# Patient Record
Sex: Female | Born: 1945 | Race: White | Hispanic: No | Marital: Single | Smoking: Never smoker
Health system: Southern US, Community
[De-identification: ages and names within clinical notes are randomized; demographics above are authoritative.]

## PROBLEM LIST (undated history)

## (undated) DIAGNOSIS — R112 Nausea with vomiting, unspecified: Secondary | ICD-10-CM

## (undated) DIAGNOSIS — T4145XA Adverse effect of unspecified anesthetic, initial encounter: Secondary | ICD-10-CM

## (undated) DIAGNOSIS — M1990 Unspecified osteoarthritis, unspecified site: Secondary | ICD-10-CM

## (undated) DIAGNOSIS — T8859XA Other complications of anesthesia, initial encounter: Secondary | ICD-10-CM

## (undated) DIAGNOSIS — R519 Headache, unspecified: Secondary | ICD-10-CM

## (undated) DIAGNOSIS — R011 Cardiac murmur, unspecified: Secondary | ICD-10-CM

## (undated) DIAGNOSIS — R51 Headache: Secondary | ICD-10-CM

## (undated) DIAGNOSIS — Z9889 Other specified postprocedural states: Secondary | ICD-10-CM

## (undated) DIAGNOSIS — J189 Pneumonia, unspecified organism: Secondary | ICD-10-CM

## (undated) DIAGNOSIS — D649 Anemia, unspecified: Secondary | ICD-10-CM

## (undated) HISTORY — PX: OTHER SURGICAL HISTORY: SHX169

## (undated) HISTORY — PX: JOINT REPLACEMENT: SHX530

## (undated) HISTORY — PX: BILATERAL TEMPOROMANDIBULAR JOINT ARTHROPLASTY: SUR77

## (undated) HISTORY — PX: APPENDECTOMY: SHX54

## (undated) HISTORY — PX: HAMMER TOE SURGERY: SHX385

## (undated) HISTORY — PX: ABDOMINAL HYSTERECTOMY: SHX81

---

## 1998-10-15 ENCOUNTER — Ambulatory Visit (HOSPITAL_COMMUNITY): Admission: RE | Admit: 1998-10-15 | Discharge: 1998-10-15 | Payer: Self-pay | Admitting: Obstetrics and Gynecology

## 1998-10-29 ENCOUNTER — Ambulatory Visit (HOSPITAL_COMMUNITY): Admission: RE | Admit: 1998-10-29 | Discharge: 1998-10-29 | Payer: Self-pay | Admitting: Obstetrics and Gynecology

## 1998-10-29 ENCOUNTER — Encounter: Payer: Self-pay | Admitting: Obstetrics and Gynecology

## 1998-11-12 ENCOUNTER — Other Ambulatory Visit: Admission: RE | Admit: 1998-11-12 | Discharge: 1998-11-12 | Payer: Self-pay | Admitting: Obstetrics and Gynecology

## 1999-03-11 ENCOUNTER — Other Ambulatory Visit: Admission: RE | Admit: 1999-03-11 | Discharge: 1999-03-11 | Payer: Self-pay | Admitting: Obstetrics and Gynecology

## 1999-12-29 ENCOUNTER — Other Ambulatory Visit: Admission: RE | Admit: 1999-12-29 | Discharge: 1999-12-29 | Payer: Self-pay | Admitting: Obstetrics and Gynecology

## 2001-03-21 ENCOUNTER — Other Ambulatory Visit: Admission: RE | Admit: 2001-03-21 | Discharge: 2001-03-21 | Payer: Self-pay | Admitting: Obstetrics and Gynecology

## 2002-05-16 ENCOUNTER — Other Ambulatory Visit: Admission: RE | Admit: 2002-05-16 | Discharge: 2002-05-16 | Payer: Self-pay | Admitting: Obstetrics and Gynecology

## 2003-07-27 ENCOUNTER — Other Ambulatory Visit: Admission: RE | Admit: 2003-07-27 | Discharge: 2003-07-27 | Payer: Self-pay | Admitting: Obstetrics and Gynecology

## 2003-11-08 ENCOUNTER — Ambulatory Visit (HOSPITAL_COMMUNITY): Admission: RE | Admit: 2003-11-08 | Discharge: 2003-11-08 | Payer: Self-pay | Admitting: Obstetrics and Gynecology

## 2004-06-13 ENCOUNTER — Ambulatory Visit: Payer: Self-pay | Admitting: Internal Medicine

## 2004-06-26 ENCOUNTER — Ambulatory Visit: Payer: Self-pay | Admitting: Internal Medicine

## 2005-03-20 ENCOUNTER — Other Ambulatory Visit: Admission: RE | Admit: 2005-03-20 | Discharge: 2005-03-20 | Payer: Self-pay | Admitting: Obstetrics and Gynecology

## 2005-12-21 ENCOUNTER — Emergency Department (HOSPITAL_COMMUNITY): Admission: EM | Admit: 2005-12-21 | Discharge: 2005-12-21 | Payer: Self-pay | Admitting: Emergency Medicine

## 2006-07-08 ENCOUNTER — Other Ambulatory Visit: Admission: RE | Admit: 2006-07-08 | Discharge: 2006-07-08 | Payer: Self-pay | Admitting: Obstetrics & Gynecology

## 2008-01-25 ENCOUNTER — Ambulatory Visit: Payer: Self-pay | Admitting: Family Medicine

## 2008-01-25 ENCOUNTER — Encounter: Admission: RE | Admit: 2008-01-25 | Discharge: 2008-01-25 | Payer: Self-pay | Admitting: Family Medicine

## 2009-05-06 ENCOUNTER — Encounter: Admission: RE | Admit: 2009-05-06 | Discharge: 2009-05-06 | Payer: Self-pay | Admitting: Obstetrics and Gynecology

## 2009-05-15 ENCOUNTER — Encounter: Admission: RE | Admit: 2009-05-15 | Discharge: 2009-05-15 | Payer: Self-pay | Admitting: Obstetrics and Gynecology

## 2009-06-04 ENCOUNTER — Encounter (INDEPENDENT_AMBULATORY_CARE_PROVIDER_SITE_OTHER): Payer: Self-pay | Admitting: *Deleted

## 2009-06-17 ENCOUNTER — Ambulatory Visit: Payer: Self-pay | Admitting: Family Medicine

## 2009-11-12 ENCOUNTER — Encounter: Admission: RE | Admit: 2009-11-12 | Discharge: 2009-11-12 | Payer: Self-pay | Admitting: Obstetrics and Gynecology

## 2010-04-21 ENCOUNTER — Encounter (INDEPENDENT_AMBULATORY_CARE_PROVIDER_SITE_OTHER): Payer: Self-pay | Admitting: *Deleted

## 2010-05-12 ENCOUNTER — Encounter
Admission: RE | Admit: 2010-05-12 | Discharge: 2010-05-12 | Payer: Self-pay | Source: Home / Self Care | Attending: Obstetrics and Gynecology | Admitting: Obstetrics and Gynecology

## 2010-07-03 NOTE — Letter (Signed)
Summary: Pre Visit Letter Revised  [facility]  [address] [place], [place] [address]   Phone: [phone]  Fax: [phone]        04/21/2010 MRN: [id number]    Catherine Rowe [address] [place], [place]  [address]   Procedure Date:  June 20, 2010   Welcome to the Gastroenterology Division at [facility].    You are scheduled to see a nurse for your pre-procedure visit on 06/06/10 at 8:30 A.M.  on the 3rd floor at [facility], [address]  We ask that you try to arrive at our office 15 minutes prior to your appointment time to allow for check-in.  Please take a minute to review the attached form.  If you answer "Yes" to one or more of the questions on the first page, we ask that you call the person listed at your earliest opportunity.  If you answer "No" to all of the questions, please complete the rest of the form and bring it to your appointment.    Your nurse visit will consist of discussing your medical and surgical history, your immediate family medical history, and your medications.   If you are unable to list all of your medications on the form, please bring the medication bottles to your appointment and we will list them.  We will need to be aware of both prescribed and over the counter drugs.  We will need to know exact dosage information as well.    Please be prepared to read and sign documents such as consent forms, a financial agreement, and acknowledgement forms.  If necessary, and with your consent, a friend or relative is welcome to sit-in on the nurse visit with you.  Please bring your insurance card so that we may make a copy of it.  If your insurance requires a referral to see a specialist, please bring your referral form from your primary care physician.  No co-pay is required for this nurse visit.     If you cannot keep your appointment, please call [phone] to cancel or reschedule prior to your appointment date.  This  allows [place] the opportunity to schedule an appointment for another patient in need of care.    Thank you for choosing [facility] for your medical needs.  We appreciate the opportunity to care for you.  Please visit [place] at our website  to learn more about our practice.  Sincerely, The Gastroenterology Division

## 2010-07-03 NOTE — Letter (Signed)
Summary: Colonoscopy Date Change Letter  [facility]  [address] [place], [place] [address]   Phone: [phone]  Fax: [phone]      June 04, 2009 MRN: [id number]   Catherine Rowe [address] [place], [place]  [address]   Dear [name],   Previously you were recommended to have a repeat colonoscopy around this time. Your chart was recently reviewed by Dr. Lina Sar of [facility]. Follow up colonoscopy is now recommended in January 2013. This revised recommendation is based on current, nationally recognized guidelines for colorectal cancer screening and polyp surveillance. These guidelines are endorsed by the American Cancer Society, [employer] on Colorectal Cancer as well as numerous other major medical organizations.  Please understand that our recommendation assumes that you do not have any new symptoms such as bleeding, a change in bowel habits, anemia, or significant abdominal discomfort. If you do have any concerning GI symptoms or want to discuss the guideline recommendations, please call to arrange an office visit at your earliest convenience. Otherwise we will keep you in our reminder system and contact you 1-2 months prior to the date listed above to schedule your next colonoscopy.  Thank you,  Hedwig Morton. Juanda Chance, M.D.  [facility] Gastroenterology Division [phone]

## 2011-07-15 ENCOUNTER — Encounter: Payer: Self-pay | Admitting: Internal Medicine

## 2012-02-04 ENCOUNTER — Other Ambulatory Visit: Payer: Self-pay | Admitting: Family Medicine

## 2012-02-04 DIAGNOSIS — R413 Other amnesia: Secondary | ICD-10-CM

## 2012-02-08 ENCOUNTER — Ambulatory Visit
Admission: RE | Admit: 2012-02-08 | Discharge: 2012-02-08 | Disposition: A | Discharge status: Home / Self Care | Payer: Medicare Other | Source: Ambulatory Visit | Attending: Family Medicine | Admitting: Family Medicine

## 2012-02-08 DIAGNOSIS — R413 Other amnesia: Secondary | ICD-10-CM

## 2012-03-21 ENCOUNTER — Other Ambulatory Visit: Payer: Self-pay

## 2012-03-21 DIAGNOSIS — G454 Transient global amnesia: Secondary | ICD-10-CM

## 2012-03-21 DIAGNOSIS — S0990XA Unspecified injury of head, initial encounter: Secondary | ICD-10-CM

## 2012-03-25 ENCOUNTER — Ambulatory Visit (HOSPITAL_COMMUNITY): Payer: Medicare Other | Attending: Internal Medicine | Admitting: Radiology

## 2012-03-25 DIAGNOSIS — I359 Nonrheumatic aortic valve disorder, unspecified: Secondary | ICD-10-CM

## 2012-03-25 DIAGNOSIS — S0990XA Unspecified injury of head, initial encounter: Secondary | ICD-10-CM

## 2012-03-25 DIAGNOSIS — I369 Nonrheumatic tricuspid valve disorder, unspecified: Secondary | ICD-10-CM | POA: Insufficient documentation

## 2012-03-25 DIAGNOSIS — G454 Transient global amnesia: Secondary | ICD-10-CM | POA: Insufficient documentation

## 2012-03-25 DIAGNOSIS — I080 Rheumatic disorders of both mitral and aortic valves: Secondary | ICD-10-CM | POA: Insufficient documentation

## 2012-03-25 NOTE — Progress Notes (Signed)
Echocardiogram performed.  

## 2012-03-29 ENCOUNTER — Encounter (HOSPITAL_COMMUNITY): Payer: Self-pay | Admitting: Neurology

## 2014-06-16 ENCOUNTER — Encounter: Payer: Self-pay | Admitting: Internal Medicine

## 2015-02-26 ENCOUNTER — Encounter: Payer: Self-pay | Admitting: Internal Medicine

## 2015-03-07 DIAGNOSIS — Z780 Asymptomatic menopausal state: Secondary | ICD-10-CM | POA: Diagnosis not present

## 2015-03-07 DIAGNOSIS — M8589 Other specified disorders of bone density and structure, multiple sites: Secondary | ICD-10-CM | POA: Diagnosis not present

## 2015-03-15 DIAGNOSIS — Z1211 Encounter for screening for malignant neoplasm of colon: Secondary | ICD-10-CM | POA: Diagnosis not present

## 2015-09-10 DIAGNOSIS — R69 Illness, unspecified: Secondary | ICD-10-CM | POA: Diagnosis not present

## 2015-11-20 DIAGNOSIS — M1711 Unilateral primary osteoarthritis, right knee: Secondary | ICD-10-CM | POA: Diagnosis not present

## 2015-11-20 DIAGNOSIS — M1712 Unilateral primary osteoarthritis, left knee: Secondary | ICD-10-CM | POA: Diagnosis not present

## 2015-11-20 DIAGNOSIS — M170 Bilateral primary osteoarthritis of knee: Secondary | ICD-10-CM | POA: Diagnosis not present

## 2016-03-12 DIAGNOSIS — R69 Illness, unspecified: Secondary | ICD-10-CM | POA: Diagnosis not present

## 2016-04-03 DIAGNOSIS — Z6829 Body mass index (BMI) 29.0-29.9, adult: Secondary | ICD-10-CM | POA: Diagnosis not present

## 2016-04-03 DIAGNOSIS — Z0000 Encounter for general adult medical examination without abnormal findings: Secondary | ICD-10-CM | POA: Diagnosis not present

## 2016-06-15 DIAGNOSIS — E7800 Pure hypercholesterolemia, unspecified: Secondary | ICD-10-CM | POA: Diagnosis not present

## 2016-06-15 DIAGNOSIS — R030 Elevated blood-pressure reading, without diagnosis of hypertension: Secondary | ICD-10-CM | POA: Diagnosis not present

## 2016-06-15 DIAGNOSIS — Z79899 Other long term (current) drug therapy: Secondary | ICD-10-CM | POA: Diagnosis not present

## 2016-06-15 DIAGNOSIS — Z0001 Encounter for general adult medical examination with abnormal findings: Secondary | ICD-10-CM | POA: Diagnosis not present

## 2016-08-17 DIAGNOSIS — Z1211 Encounter for screening for malignant neoplasm of colon: Secondary | ICD-10-CM | POA: Diagnosis not present

## 2016-10-14 DIAGNOSIS — M1712 Unilateral primary osteoarthritis, left knee: Secondary | ICD-10-CM | POA: Diagnosis not present

## 2016-10-14 DIAGNOSIS — M1711 Unilateral primary osteoarthritis, right knee: Secondary | ICD-10-CM | POA: Diagnosis not present

## 2016-10-19 DIAGNOSIS — E7800 Pure hypercholesterolemia, unspecified: Secondary | ICD-10-CM | POA: Diagnosis not present

## 2016-12-08 DIAGNOSIS — M545 Low back pain: Secondary | ICD-10-CM | POA: Diagnosis not present

## 2016-12-08 DIAGNOSIS — M25562 Pain in left knee: Secondary | ICD-10-CM | POA: Diagnosis not present

## 2016-12-08 DIAGNOSIS — K5900 Constipation, unspecified: Secondary | ICD-10-CM | POA: Diagnosis not present

## 2016-12-08 DIAGNOSIS — Z6828 Body mass index (BMI) 28.0-28.9, adult: Secondary | ICD-10-CM | POA: Diagnosis not present

## 2016-12-08 DIAGNOSIS — Z0000 Encounter for general adult medical examination without abnormal findings: Secondary | ICD-10-CM | POA: Diagnosis not present

## 2016-12-08 DIAGNOSIS — E669 Obesity, unspecified: Secondary | ICD-10-CM | POA: Diagnosis not present

## 2016-12-08 DIAGNOSIS — M25561 Pain in right knee: Secondary | ICD-10-CM | POA: Diagnosis not present

## 2016-12-08 DIAGNOSIS — G4700 Insomnia, unspecified: Secondary | ICD-10-CM | POA: Diagnosis not present

## 2016-12-08 DIAGNOSIS — N329 Bladder disorder, unspecified: Secondary | ICD-10-CM | POA: Diagnosis not present

## 2017-07-02 DIAGNOSIS — R0789 Other chest pain: Secondary | ICD-10-CM | POA: Diagnosis not present

## 2017-07-14 DIAGNOSIS — M170 Bilateral primary osteoarthritis of knee: Secondary | ICD-10-CM | POA: Diagnosis not present

## 2017-07-28 ENCOUNTER — Other Ambulatory Visit: Payer: Self-pay | Admitting: Family Medicine

## 2017-07-28 ENCOUNTER — Ambulatory Visit
Admission: RE | Admit: 2017-07-28 | Discharge: 2017-07-28 | Disposition: A | Discharge status: Home / Self Care | Payer: Medicare Other | Source: Ambulatory Visit | Attending: Family Medicine | Admitting: Family Medicine

## 2017-07-28 DIAGNOSIS — Z01818 Encounter for other preprocedural examination: Secondary | ICD-10-CM | POA: Diagnosis not present

## 2017-07-28 DIAGNOSIS — R918 Other nonspecific abnormal finding of lung field: Secondary | ICD-10-CM | POA: Diagnosis not present

## 2017-07-28 DIAGNOSIS — M25562 Pain in left knee: Secondary | ICD-10-CM | POA: Diagnosis not present

## 2017-09-01 DIAGNOSIS — H2513 Age-related nuclear cataract, bilateral: Secondary | ICD-10-CM | POA: Diagnosis not present

## 2017-09-10 ENCOUNTER — Ambulatory Visit: Payer: Self-pay | Admitting: Orthopedic Surgery

## 2017-10-12 NOTE — Patient Instructions (Addendum)
Catherine Rowe  10/12/2017   Your procedure is scheduled on: 10-22-17  Report to [facility] Main  Entrance   Report to admitting at     1045 AM    Call this number if you have problems the morning of surgery [phone]    Remember: Do not eat food:After Midnight. You may have clear liquids until 0715 am then nothing by mouth     CLEAR LIQUID DIET   Foods Allowed                                                                     Foods Excluded  Coffee and tea, regular and decaf                             liquids that you cannot  Plain Jell-O in any flavor                                             see through such as: Fruit ices (not with fruit pulp)                                     milk, soups, orange juice  Iced Popsicles                                    All solid food Carbonated beverages, regular and diet                                    Cranberry, grape and apple juices Sports drinks like Gatorade Lightly seasoned clear broth or consume(fat free) Sugar, honey syrup   _____________________________________________________________________     Take these medicines the morning of surgery with A SIP OF WATER: none                                You may not have any metal on your body including hair pins and              piercings  Do not wear jewelry, make-up, lotions, powders or perfumes, deodorant             Do not wear nail polish.  Do not shave  48 hours prior to surgery.             Do not bring valuables to the hospital. [facility] IS NOT             RESPONSIBLE   FOR VALUABLES.  Contacts, dentures or bridgework may not be worn into surgery.  Leave suitcase in the car. After surgery it may be brought to your room.  Please read over the following fact sheets you were given: _____________________________________________________________________          [facility] - Preparing for Surgery Before surgery, you can  play an important role.  Because skin is not sterile, your skin needs to be as free of germs as possible.  You can reduce the number of germs on your skin by washing with CHG (chlorahexidine gluconate) soap before surgery.  CHG is an antiseptic cleaner which kills germs and bonds with the skin to continue killing germs even after washing. Please DO NOT use if you have an allergy to CHG or antibacterial soaps.  If your skin becomes reddened/irritated stop using the CHG and inform your nurse when you arrive at Short Stay. Do not shave (including legs and underarms) for at least 48 hours prior to the first CHG shower.  You may shave your face/neck. Please follow these instructions carefully:  1.  Shower with CHG Soap the night before surgery and the  morning of Surgery.  2.  If you choose to wash your hair, wash your hair first as usual with your  normal  shampoo.  3.  After you shampoo, rinse your hair and body thoroughly to remove the  shampoo.                           4.  Use CHG as you would any other liquid soap.  You can apply chg directly  to the skin and wash                       Gently with a scrungie or clean washcloth.  5.  Apply the CHG Soap to your body ONLY FROM THE NECK DOWN.   Do not use on face/ open                           Wound or open sores. Avoid contact with eyes, ears mouth and genitals (private parts).                       Wash face,  Genitals (private parts) with your normal soap.             6.  Wash thoroughly, paying special attention to the area where your surgery  will be performed.  7.  Thoroughly rinse your body with warm water from the neck down.  8.  DO NOT shower/wash with your normal soap after using and rinsing off  the CHG Soap.                9.  Pat yourself dry with a clean towel.            10.  Wear clean pajamas.            11.  Place clean sheets on your bed the night of your first shower and do not  sleep with pets. Day of Surgery : Do not apply any  lotions/deodorants the morning of surgery.  Please wear clean clothes to the hospital/surgery center.  FAILURE TO FOLLOW THESE INSTRUCTIONS MAY RESULT IN THE CANCELLATION OF YOUR SURGERY PATIENT SIGNATURE_________________________________  NURSE SIGNATURE__________________________________  ________________________________________________________________________  WHAT IS A BLOOD TRANSFUSION? Blood Transfusion Information  A transfusion is the replacement of blood or some of its parts. Blood is made up of multiple cells which provide different functions.  Red blood cells  carry oxygen and are used for blood loss replacement.  White blood cells fight against infection.  Platelets control bleeding.  Plasma helps clot blood.  Other blood products are available for specialized needs, such as hemophilia or other clotting disorders. BEFORE THE TRANSFUSION  Who gives blood for transfusions?   Healthy volunteers who are fully evaluated to make sure their blood is safe. This is blood bank blood. Transfusion therapy is the safest it has ever been in the practice of medicine. Before blood is taken from a donor, a complete history is taken to make sure that person has no history of diseases nor engages in risky social behavior (examples are intravenous drug use or sexual activity with multiple partners). The donor's travel history is screened to minimize risk of transmitting infections, such as malaria. The donated blood is tested for signs of infectious diseases, such as HIV and hepatitis. The blood is then tested to be sure it is compatible with you in order to minimize the chance of a transfusion reaction. If you or a relative donates blood, this is often done in anticipation of surgery and is not appropriate for emergency situations. It takes many days to process the donated blood. RISKS AND COMPLICATIONS Although transfusion therapy is very safe and saves many lives, the main dangers of transfusion  include:   Getting an infectious disease.  Developing a transfusion reaction. This is an allergic reaction to something in the blood you were given. Every precaution is taken to prevent this. The decision to have a blood transfusion has been considered carefully by your caregiver before blood is given. Blood is not given unless the benefits outweigh the risks. AFTER THE TRANSFUSION  Right after receiving a blood transfusion, you will usually feel much better and more energetic. This is especially true if your red blood cells have gotten low (anemic). The transfusion raises the level of the red blood cells which carry oxygen, and this usually causes an energy increase.  The nurse administering the transfusion will monitor you carefully for complications. HOME CARE INSTRUCTIONS  No special instructions are needed after a transfusion. You may find your energy is better. Speak with your caregiver about any limitations on activity for underlying diseases you may have. SEEK MEDICAL CARE IF:   Your condition is not improving after your transfusion.  You develop redness or irritation at the intravenous (IV) site. SEEK IMMEDIATE MEDICAL CARE IF:  Any of the following symptoms occur over the next 12 hours:  Shaking chills.  You have a temperature by mouth above 102 F (38.9 C), not controlled by medicine.  Chest, back, or muscle pain.  People around you feel you are not acting correctly or are confused.  Shortness of breath or difficulty breathing.  Dizziness and fainting.  You get a rash or develop hives.  You have a decrease in urine output.  Your urine turns a dark color or changes to pink, red, or brown. Any of the following symptoms occur over the next 10 days:  You have a temperature by mouth above 102 F (38.9 C), not controlled by medicine.  Shortness of breath.  Weakness after normal activity.  The white part of the eye turns yellow (jaundice).  You have a decrease in  the amount of urine or are urinating less often.  Your urine turns a dark color or changes to pink, red, or brown. Document Released: 05/15/2000 Document Revised: 08/10/2011 Document Reviewed: 01/02/2008 [facility] Patient Information 2014 [place], [place].  _______________________________________________________________________  Incentive  Spirometer  An incentive spirometer is a tool that can help keep your lungs clear and active. This tool measures how well you are filling your lungs with each breath. Taking long deep breaths may help reverse or decrease the chance of developing breathing (pulmonary) problems (especially infection) following:  A long period of time when you are unable to move or be active. BEFORE THE PROCEDURE   If the spirometer includes an indicator to show your best effort, your nurse or respiratory therapist will set it to a desired goal.  If possible, sit up straight or lean slightly forward. Try not to slouch.  Hold the incentive spirometer in an upright position. INSTRUCTIONS FOR USE  1. Sit on the edge of your bed if possible, or sit up as far as you can in bed or on a chair. 2. Hold the incentive spirometer in an upright position. 3. Breathe out normally. 4. Place the mouthpiece in your mouth and seal your lips tightly around it. 5. Breathe in slowly and as deeply as possible, raising the piston or the ball toward the top of the column. 6. Hold your breath for 3-5 seconds or for as long as possible. Allow the piston or ball to fall to the bottom of the column. 7. Remove the mouthpiece from your mouth and breathe out normally. 8. Rest for a few seconds and repeat Steps 1 through 7 at least 10 times every 1-2 hours when you are awake. Take your time and take a few normal breaths between deep breaths. 9. The spirometer may include an indicator to show your best effort. Use the indicator as a goal to work toward during each repetition. 10. After each set of 10 deep  breaths, practice coughing to be sure your lungs are clear. If you have an incision (the cut made at the time of surgery), support your incision when coughing by placing a pillow or rolled up towels firmly against it. Once you are able to get out of bed, walk around indoors and cough well. You may stop using the incentive spirometer when instructed by your caregiver.  RISKS AND COMPLICATIONS  Take your time so you do not get dizzy or light-headed.  If you are in pain, you may need to take or ask for pain medication before doing incentive spirometry. It is harder to take a deep breath if you are having pain. AFTER USE  Rest and breathe slowly and easily.  It can be helpful to keep track of a log of your progress. Your caregiver can provide you with a simple table to help with this. If you are using the spirometer at home, follow these instructions: SEEK MEDICAL CARE IF:   You are having difficultly using the spirometer.  You have trouble using the spirometer as often as instructed.  Your pain medication is not giving enough relief while using the spirometer.  You develop fever of 100.5 F (38.1 C) or higher. SEEK IMMEDIATE MEDICAL CARE IF:   You cough up bloody sputum that had not been present before.  You develop fever of 102 F (38.9 C) or greater.  You develop worsening pain at or near the incision site. MAKE SURE YOU:   Understand these instructions.  Will watch your condition.  Will get help right away if you are not doing well or get worse. Document Released: 09/28/2006 Document Revised: 08/10/2011 Document Reviewed: 11/29/2006 [facility] Patient Information 2014 [place], [place].   ________________________________________________________________________

## 2017-10-12 NOTE — Progress Notes (Signed)
Clearance 08-03-17 chart  cxr 07-28-17 epic

## 2017-10-13 ENCOUNTER — Encounter (HOSPITAL_COMMUNITY): Payer: Self-pay

## 2017-10-13 ENCOUNTER — Other Ambulatory Visit: Payer: Self-pay

## 2017-10-13 ENCOUNTER — Encounter (HOSPITAL_COMMUNITY)
Admission: RE | Admit: 2017-10-13 | Discharge: 2017-10-13 | Disposition: A | Discharge status: Home / Self Care | Payer: Medicare HMO | Source: Ambulatory Visit | Attending: Specialist | Admitting: Specialist

## 2017-10-13 DIAGNOSIS — M1712 Unilateral primary osteoarthritis, left knee: Secondary | ICD-10-CM | POA: Insufficient documentation

## 2017-10-13 DIAGNOSIS — Z01818 Encounter for other preprocedural examination: Secondary | ICD-10-CM | POA: Diagnosis present

## 2017-10-13 HISTORY — DX: Anemia, unspecified: D64.9

## 2017-10-13 HISTORY — DX: Nausea with vomiting, unspecified: R11.2

## 2017-10-13 HISTORY — DX: Headache, unspecified: R51.9

## 2017-10-13 HISTORY — DX: Headache: R51

## 2017-10-13 HISTORY — DX: Adverse effect of unspecified anesthetic, initial encounter: T41.45XA

## 2017-10-13 HISTORY — DX: Other specified postprocedural states: Z98.890

## 2017-10-13 HISTORY — DX: Unspecified osteoarthritis, unspecified site: M19.90

## 2017-10-13 HISTORY — DX: Other complications of anesthesia, initial encounter: T88.59XA

## 2017-10-13 HISTORY — DX: Cardiac murmur, unspecified: R01.1

## 2017-10-13 HISTORY — DX: Pneumonia, unspecified organism: J18.9

## 2017-10-13 LAB — BASIC METABOLIC PANEL
ANION GAP: 10 (ref 5–15)
BUN: 11 mg/dL (ref 6–20)
CALCIUM: 9.3 mg/dL (ref 8.9–10.3)
CO2: 27 mmol/L (ref 22–32)
Chloride: 105 mmol/L (ref 101–111)
Creatinine, Ser: 0.58 mg/dL (ref 0.44–1.00)
GLUCOSE: 97 mg/dL (ref 65–99)
Potassium: 4.3 mmol/L (ref 3.5–5.1)
SODIUM: 142 mmol/L (ref 135–145)

## 2017-10-13 LAB — CBC
HCT: 39.2 % (ref 36.0–46.0)
HEMOGLOBIN: 12.9 g/dL (ref 12.0–15.0)
MCH: 30.1 pg (ref 26.0–34.0)
MCHC: 32.9 g/dL (ref 30.0–36.0)
MCV: 91.6 fL (ref 78.0–100.0)
Platelets: 262 10*3/uL (ref 150–400)
RBC: 4.28 MIL/uL (ref 3.87–5.11)
RDW: 13.3 % (ref 11.5–15.5)
WBC: 5.5 10*3/uL (ref 4.0–10.5)

## 2017-10-13 LAB — URINALYSIS, ROUTINE W REFLEX MICROSCOPIC
Bacteria, UA: NONE SEEN
Bilirubin Urine: NEGATIVE
Glucose, UA: NEGATIVE mg/dL
KETONES UR: NEGATIVE mg/dL
LEUKOCYTES UA: NEGATIVE
Nitrite: NEGATIVE
PH: 5 (ref 5.0–8.0)
Protein, ur: NEGATIVE mg/dL
Specific Gravity, Urine: 1.012 (ref 1.005–1.030)

## 2017-10-13 LAB — PROTIME-INR
INR: 0.9
PROTHROMBIN TIME: 12 s (ref 11.4–15.2)

## 2017-10-13 LAB — SURGICAL PCR SCREEN
MRSA, PCR: NEGATIVE
STAPHYLOCOCCUS AUREUS: NEGATIVE

## 2017-10-13 LAB — APTT: APTT: 28 s (ref 24–36)

## 2017-10-14 LAB — ABO/RH: ABO/RH(D): O POS

## 2017-10-15 DIAGNOSIS — M5417 Radiculopathy, lumbosacral region: Secondary | ICD-10-CM | POA: Diagnosis not present

## 2017-10-18 NOTE — H&P (Signed)
TOTAL KNEE ADMISSION H&P  Patient is being admitted for left total knee arthroplasty.  Subjective:  Chief Complaint:left knee pain.  HPI: Catherine Rowe, 72 y.o. female, has a history of pain and functional disability in the left knee due to arthritis and has failed non-surgical conservative treatments for greater than 12 weeks to includeNSAID's and/or analgesics, corticosteriod injections, weight reduction as appropriate and activity modification.  Onset of symptoms was gradual, starting 2 years ago with gradually worsening course since that time. The patient noted no past surgery on the left knee(s).  Patient currently rates pain in the left knee(s) at 8 out of 10 with activity. Patient has worsening of pain with activity and weight bearing, pain that interferes with activities of daily living, pain with passive range of motion and joint swelling.  Patient has evidence of subchondral sclerosis and joint space narrowing by imaging studies. There is no active infection.  There are no active problems to display for this patient.  Past Medical History:  Diagnosis Date  . Anemia    hx of  . Arthritis   . Complication of anesthesia    BP drops at times had to stay overnight for out patient surgery  . Headache    hx of migraine  . Heart murmur    1979   . Pneumonia    hx of  . PONV (postoperative nausea and vomiting)     Past Surgical History:  Procedure Laterality Date  . ABDOMINAL HYSTERECTOMY     at 72 years old   . APPENDECTOMY     1960's  . BILATERAL TEMPOROMANDIBULAR JOINT ARTHROPLASTY    . bladder tack    . HAMMER TOE SURGERY     bil feet and spurs bil feet removed  . JOINT REPLACEMENT     Left total knee 10-22-17 Dr. Thomasena Edis  . 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 rotator duff repair     right and left x2  spurs 1995 and 2000    No current facility-administered medications for this  encounter.    Current Outpatient Medications  Medication Sig Dispense Refill Last Dose  . Biotin 5000 MCG TABS Take 1 tablet by mouth daily.     . Calcium Carb-Cholecalciferol (CALCIUM 600 + D PO) Take 2 tablets by mouth daily.     . cholecalciferol (VITAMIN D) 1000 units tablet Take 1,000 Units by mouth daily.     . Misc Natural Products (GLUCOSAMINE CHOND COMPLEX/MSM PO) Take 1 tablet by mouth 2 (two) times daily.     . Multiple Vitamin (MULTIVITAMIN WITH MINERALS) TABS tablet Take 1 tablet by mouth daily.     . vitamin B-12 (CYANOCOBALAMIN) 500 MCG tablet Take 500 mcg by mouth daily.     . vitamin C (ASCORBIC ACID) 500 MG tablet Take 500 mg by mouth daily.      No Known Allergies  Social History   Tobacco Use  . Smoking status: Never Smoker  . Smokeless tobacco: Never Used  Substance Use Topics  . Alcohol use: Not Currently    Frequency: Never    No family history on file.   Review of Systems  Constitutional: Negative.   HENT: Negative.   Eyes: Negative.   Respiratory: Negative.   Cardiovascular: Negative.   Gastrointestinal: Negative.   Genitourinary: Negative.   Musculoskeletal: Positive for joint pain.  Skin: Negative.   Neurological: Negative.   Endo/Heme/Allergies: Negative.   Psychiatric/Behavioral: Negative.     Objective:  Physical Exam  Constitutional: She is oriented to person, place, and time. She appears well-developed.  HENT:  Head: Normocephalic.  Eyes: EOM are normal.  Neck: Normal range of motion.  Cardiovascular: Normal rate and intact distal pulses.  Respiratory: Effort normal.  GI: Soft.  Genitourinary:  Genitourinary Comments: Deferred  Musculoskeletal:  Left knee good ROM. Limited strength. Knee is stable.  Neurological: She is alert and oriented to person, place, and time.  Skin: Skin is warm and dry.  Psychiatric: Her behavior is normal.    Vital signs in last 24 hours:    Labs:   Estimated body mass index is 31.04 kg/m as  calculated from the following:   Height as of 10/13/17: 5' 1.5" (1.562 m).   Weight as of 10/13/17: 75.8 kg (167 lb).   Imaging Review Plain radiographs demonstrate moderate degenerative joint disease of the left knee(s). The overall alignment ismild varus. The bone quality appears to be good for age and reported activity level.   Preoperative templating of the joint replacement has been completed, documented, and submitted to the Operating Room personnel in order to optimize intra-operative equipment management.   Anticipated LOS equal to or greater than 2 midnights due to - Age 36 and older with one or more of the following:  - Obesity  - Expected need for hospital services (PT, OT, Nursing) required for safe  discharge  - Anticipated need for postoperative skilled nursing care or inpatient rehab  - Active co-morbidities: None OR   - Unanticipated findings during/Post Surgery: None  - Patient is a high risk of re-admission due to: None     Assessment/Plan:  End stage arthritis, left knee   The patient history, physical examination, clinical judgment of the provider and imaging studies are consistent with end stage degenerative joint disease of the left knee(s) and total knee arthroplasty is deemed medically necessary. The treatment options including medical management, injection therapy arthroscopy and arthroplasty were discussed at length. The risks and benefits of total knee arthroplasty were presented and reviewed. The risks due to aseptic loosening, infection, stiffness, patella tracking problems, thromboembolic complications and other imponderables were discussed. The patient acknowledged the explanation, agreed to proceed with the plan and consent was signed. Patient is being admitted for inpatient treatment for surgery, pain control, PT, OT, prophylactic antibiotics, VTE prophylaxis, progressive ambulation and ADL's and discharge planning. The patient is planning to be discharged  home with home health services.   Will use IV tranexamic acid. Contraindications and adverse affects of Tranexamic acid discussed in detail. Patient denies any of these at this time and understands the risks and benefits.

## 2017-10-21 MED ORDER — TRANEXAMIC ACID 1000 MG/10ML IV SOLN
1000.0000 mg | INTRAVENOUS | Status: AC
  Administered 2017-10-22: 1000 mg via INTRAVENOUS
  Filled 2017-10-21: qty 1100

## 2017-10-22 ENCOUNTER — Inpatient Hospital Stay (HOSPITAL_COMMUNITY)
Admission: RE | Admit: 2017-10-22 | Discharge: 2017-10-25 | DRG: 470 | Disposition: A | Discharge status: Home Health | Payer: Medicare HMO | Attending: Specialist | Admitting: Specialist

## 2017-10-22 ENCOUNTER — Inpatient Hospital Stay (HOSPITAL_COMMUNITY): Payer: Medicare HMO | Admitting: Anesthesiology

## 2017-10-22 ENCOUNTER — Encounter (HOSPITAL_COMMUNITY)
Admission: RE | Disposition: A | Discharge status: Home Health | Payer: Self-pay | Source: Home / Self Care | Attending: Specialist

## 2017-10-22 ENCOUNTER — Other Ambulatory Visit: Payer: Self-pay

## 2017-10-22 ENCOUNTER — Encounter (HOSPITAL_COMMUNITY): Payer: Self-pay | Admitting: *Deleted

## 2017-10-22 DIAGNOSIS — Z79899 Other long term (current) drug therapy: Secondary | ICD-10-CM

## 2017-10-22 DIAGNOSIS — Z96659 Presence of unspecified artificial knee joint: Secondary | ICD-10-CM

## 2017-10-22 DIAGNOSIS — M1712 Unilateral primary osteoarthritis, left knee: Principal | ICD-10-CM

## 2017-10-22 DIAGNOSIS — Z90710 Acquired absence of both cervix and uterus: Secondary | ICD-10-CM | POA: Diagnosis not present

## 2017-10-22 DIAGNOSIS — M25762 Osteophyte, left knee: Secondary | ICD-10-CM | POA: Diagnosis present

## 2017-10-22 DIAGNOSIS — M25562 Pain in left knee: Secondary | ICD-10-CM | POA: Diagnosis present

## 2017-10-22 DIAGNOSIS — G8918 Other acute postprocedural pain: Secondary | ICD-10-CM | POA: Diagnosis not present

## 2017-10-22 HISTORY — PX: TOTAL KNEE ARTHROPLASTY: SHX125

## 2017-10-22 LAB — TYPE AND SCREEN
ABO/RH(D): O POS
Antibody Screen: NEGATIVE

## 2017-10-22 SURGERY — ARTHROPLASTY, KNEE, TOTAL
Anesthesia: Spinal | Site: Knee | Laterality: Left

## 2017-10-22 MED ORDER — HYDROMORPHONE HCL 1 MG/ML IJ SOLN
INTRAMUSCULAR | Status: AC
  Administered 2017-10-22: 0.5 mg via INTRAVENOUS
  Filled 2017-10-22: qty 1

## 2017-10-22 MED ORDER — FENTANYL CITRATE (PF) 100 MCG/2ML IJ SOLN
INTRAMUSCULAR | Status: AC
  Administered 2017-10-22: 50 ug via INTRAVENOUS
  Filled 2017-10-22: qty 2

## 2017-10-22 MED ORDER — ONDANSETRON HCL 4 MG PO TABS
4.0000 mg | ORAL_TABLET | Freq: Four times a day (QID) | ORAL | Status: DC | PRN
  Administered 2017-10-22 – 2017-10-24 (×2): 4 mg via ORAL
  Filled 2017-10-22 (×4): qty 1

## 2017-10-22 MED ORDER — OXYCODONE HCL 5 MG PO TABS
5.0000 mg | ORAL_TABLET | ORAL | Status: DC | PRN
  Administered 2017-10-22: 5 mg via ORAL
  Filled 2017-10-22: qty 1

## 2017-10-22 MED ORDER — PROPOFOL 10 MG/ML IV BOLUS
INTRAVENOUS | Status: AC
  Filled 2017-10-22: qty 20

## 2017-10-22 MED ORDER — DIPHENHYDRAMINE HCL 12.5 MG/5ML PO ELIX: 12.5000 mg | ORAL_SOLUTION | ORAL | Status: DC | PRN

## 2017-10-22 MED ORDER — FERROUS SULFATE 325 (65 FE) MG PO TABS
325.0000 mg | ORAL_TABLET | Freq: Three times a day (TID) | ORAL | Status: DC
  Administered 2017-10-22 – 2017-10-25 (×6): 325 mg via ORAL
  Filled 2017-10-22 (×8): qty 1

## 2017-10-22 MED ORDER — PROPOFOL 500 MG/50ML IV EMUL
INTRAVENOUS | Status: DC | PRN
  Administered 2017-10-22: 75 ug/kg/min via INTRAVENOUS

## 2017-10-22 MED ORDER — LACTATED RINGERS IV SOLN
INTRAVENOUS | Status: DC
  Administered 2017-10-22 (×3): via INTRAVENOUS

## 2017-10-22 MED ORDER — KETOROLAC TROMETHAMINE 30 MG/ML IJ SOLN
INTRAMUSCULAR | Status: AC
  Filled 2017-10-22: qty 1

## 2017-10-22 MED ORDER — ROPIVACAINE HCL 5 MG/ML IJ SOLN
INTRAMUSCULAR | Status: DC | PRN
  Administered 2017-10-22: 20 mL via PERINEURAL

## 2017-10-22 MED ORDER — METOCLOPRAMIDE HCL 5 MG/ML IJ SOLN: 5.0000 mg | Freq: Three times a day (TID) | INTRAMUSCULAR | Status: DC | PRN

## 2017-10-22 MED ORDER — ACETAMINOPHEN 325 MG PO TABS
325.0000 mg | ORAL_TABLET | Freq: Four times a day (QID) | ORAL | Status: DC | PRN
  Administered 2017-10-25: 650 mg via ORAL
  Filled 2017-10-22: qty 2

## 2017-10-22 MED ORDER — SODIUM CHLORIDE 0.9 % IJ SOLN
INTRAMUSCULAR | Status: AC
  Filled 2017-10-22: qty 50

## 2017-10-22 MED ORDER — POLYETHYLENE GLYCOL 3350 17 G PO PACK: 17.0000 g | PACK | Freq: Every day | ORAL | Status: DC | PRN

## 2017-10-22 MED ORDER — OXYCODONE HCL 5 MG PO TABS: 5.0000 mg | ORAL_TABLET | Freq: Once | ORAL | Status: DC | PRN

## 2017-10-22 MED ORDER — FENTANYL CITRATE (PF) 100 MCG/2ML IJ SOLN
INTRAMUSCULAR | Status: AC
  Filled 2017-10-22: qty 2

## 2017-10-22 MED ORDER — ONDANSETRON HCL 4 MG/2ML IJ SOLN
INTRAMUSCULAR | Status: DC | PRN
  Administered 2017-10-22: 4 mg via INTRAVENOUS

## 2017-10-22 MED ORDER — DEXAMETHASONE SODIUM PHOSPHATE 10 MG/ML IJ SOLN
10.0000 mg | Freq: Once | INTRAMUSCULAR | Status: AC
  Administered 2017-10-22: 10 mg via INTRAVENOUS

## 2017-10-22 MED ORDER — OXYCODONE HCL 5 MG/5ML PO SOLN
5.0000 mg | Freq: Once | ORAL | Status: DC | PRN
  Filled 2017-10-22: qty 5

## 2017-10-22 MED ORDER — SODIUM CHLORIDE 0.9 % IJ SOLN
INTRAMUSCULAR | Status: DC | PRN
  Administered 2017-10-22: 30 mL

## 2017-10-22 MED ORDER — HYDROMORPHONE HCL 1 MG/ML IJ SOLN
0.5000 mg | INTRAMUSCULAR | Status: DC | PRN
  Administered 2017-10-24: 0.5 mg via INTRAVENOUS
  Administered 2017-10-24: 1 mg via INTRAVENOUS
  Filled 2017-10-22 (×2): qty 1

## 2017-10-22 MED ORDER — DOCUSATE SODIUM 100 MG PO CAPS
100.0000 mg | ORAL_CAPSULE | Freq: Two times a day (BID) | ORAL | Status: DC
  Administered 2017-10-22 – 2017-10-25 (×6): 100 mg via ORAL
  Filled 2017-10-22 (×6): qty 1

## 2017-10-22 MED ORDER — CEFAZOLIN SODIUM-DEXTROSE 2-4 GM/100ML-% IV SOLN
2.0000 g | INTRAVENOUS | Status: AC
  Administered 2017-10-22: 2 g via INTRAVENOUS
  Filled 2017-10-22: qty 100

## 2017-10-22 MED ORDER — BISACODYL 5 MG PO TBEC: 5.0000 mg | DELAYED_RELEASE_TABLET | Freq: Every day | ORAL | Status: DC | PRN

## 2017-10-22 MED ORDER — BUPIVACAINE-EPINEPHRINE 0.25% -1:200000 IJ SOLN
INTRAMUSCULAR | Status: DC | PRN
  Administered 2017-10-22: 30 mL

## 2017-10-22 MED ORDER — CEFAZOLIN SODIUM-DEXTROSE 2-4 GM/100ML-% IV SOLN
2.0000 g | Freq: Four times a day (QID) | INTRAVENOUS | Status: AC
  Administered 2017-10-22 – 2017-10-23 (×2): 2 g via INTRAVENOUS
  Filled 2017-10-22 (×2): qty 100

## 2017-10-22 MED ORDER — PHENOL 1.4 % MT LIQD: 1.0000 | OROMUCOSAL | Status: DC | PRN

## 2017-10-22 MED ORDER — HYDROMORPHONE HCL 1 MG/ML IJ SOLN
0.2500 mg | INTRAMUSCULAR | Status: DC | PRN
  Administered 2017-10-22 (×2): 0.5 mg via INTRAVENOUS

## 2017-10-22 MED ORDER — METHOCARBAMOL 500 MG PO TABS: 500.0000 mg | ORAL_TABLET | Freq: Three times a day (TID) | ORAL | 1 refills | Status: AC | PRN

## 2017-10-22 MED ORDER — MENTHOL 3 MG MT LOZG: 1.0000 | LOZENGE | OROMUCOSAL | Status: DC | PRN

## 2017-10-22 MED ORDER — SODIUM CHLORIDE 0.9 % IV SOLN
INTRAVENOUS | Status: DC
  Administered 2017-10-22: 18:00:00 via INTRAVENOUS

## 2017-10-22 MED ORDER — DEXAMETHASONE SODIUM PHOSPHATE 10 MG/ML IJ SOLN
10.0000 mg | Freq: Once | INTRAMUSCULAR | Status: AC
  Administered 2017-10-23: 10 mg via INTRAVENOUS
  Filled 2017-10-22: qty 1

## 2017-10-22 MED ORDER — MIDAZOLAM HCL 2 MG/2ML IJ SOLN
1.0000 mg | INTRAMUSCULAR | Status: DC
  Administered 2017-10-22: 2 mg via INTRAVENOUS

## 2017-10-22 MED ORDER — EPHEDRINE SULFATE 50 MG/ML IJ SOLN
INTRAMUSCULAR | Status: DC | PRN
  Administered 2017-10-22 (×2): 10 mg via INTRAVENOUS

## 2017-10-22 MED ORDER — PHENYLEPHRINE HCL 10 MG/ML IJ SOLN
INTRAMUSCULAR | Status: DC | PRN
  Administered 2017-10-22 (×5): 80 ug via INTRAVENOUS

## 2017-10-22 MED ORDER — OXYCODONE HCL 5 MG PO TABS: 10.0000 mg | ORAL_TABLET | ORAL | Status: DC | PRN

## 2017-10-22 MED ORDER — PROMETHAZINE HCL 25 MG/ML IJ SOLN: 6.2500 mg | INTRAMUSCULAR | Status: DC | PRN

## 2017-10-22 MED ORDER — MAGNESIUM CITRATE PO SOLN: 1.0000 | Freq: Once | ORAL | Status: DC | PRN

## 2017-10-22 MED ORDER — ENOXAPARIN SODIUM 30 MG/0.3ML ~~LOC~~ SOLN
30.0000 mg | Freq: Two times a day (BID) | SUBCUTANEOUS | Status: DC
  Filled 2017-10-22 (×3): qty 0.3

## 2017-10-22 MED ORDER — ACETAMINOPHEN 500 MG PO TABS
1000.0000 mg | ORAL_TABLET | Freq: Four times a day (QID) | ORAL | Status: AC
  Administered 2017-10-22 – 2017-10-23 (×4): 1000 mg via ORAL
  Filled 2017-10-22 (×4): qty 2

## 2017-10-22 MED ORDER — BUPIVACAINE-EPINEPHRINE (PF) 0.25% -1:200000 IJ SOLN
INTRAMUSCULAR | Status: AC
  Filled 2017-10-22: qty 30

## 2017-10-22 MED ORDER — METOCLOPRAMIDE HCL 5 MG PO TABS: 5.0000 mg | ORAL_TABLET | Freq: Three times a day (TID) | ORAL | Status: DC | PRN

## 2017-10-22 MED ORDER — ALUM & MAG HYDROXIDE-SIMETH 200-200-20 MG/5ML PO SUSP: 30.0000 mL | ORAL | Status: DC | PRN

## 2017-10-22 MED ORDER — OXYCODONE HCL 5 MG PO TABS: 5.0000 mg | ORAL_TABLET | ORAL | 0 refills | Status: DC | PRN

## 2017-10-22 MED ORDER — SODIUM CHLORIDE 0.9 % IR SOLN
Status: DC | PRN
  Administered 2017-10-22: 1000 mL

## 2017-10-22 MED ORDER — METHOCARBAMOL 500 MG PO TABS
500.0000 mg | ORAL_TABLET | Freq: Four times a day (QID) | ORAL | Status: DC | PRN
  Administered 2017-10-23 – 2017-10-24 (×6): 500 mg via ORAL
  Filled 2017-10-22 (×7): qty 1

## 2017-10-22 MED ORDER — PROPOFOL 10 MG/ML IV BOLUS
INTRAVENOUS | Status: AC
  Filled 2017-10-22: qty 60

## 2017-10-22 MED ORDER — ASPIRIN EC 325 MG PO TBEC: 325.0000 mg | DELAYED_RELEASE_TABLET | Freq: Two times a day (BID) | ORAL | 0 refills | Status: AC

## 2017-10-22 MED ORDER — CHLORHEXIDINE GLUCONATE 4 % EX LIQD: 60.0000 mL | Freq: Once | CUTANEOUS | Status: DC

## 2017-10-22 MED ORDER — KETOROLAC TROMETHAMINE 30 MG/ML IJ SOLN
INTRAMUSCULAR | Status: DC | PRN
  Administered 2017-10-22: 30 mg via INTRAMUSCULAR

## 2017-10-22 MED ORDER — ONDANSETRON HCL 4 MG/2ML IJ SOLN: 4.0000 mg | Freq: Four times a day (QID) | INTRAMUSCULAR | Status: DC | PRN

## 2017-10-22 MED ORDER — MIDAZOLAM HCL 2 MG/2ML IJ SOLN
INTRAMUSCULAR | Status: AC
  Administered 2017-10-22: 2 mg via INTRAVENOUS
  Filled 2017-10-22: qty 2

## 2017-10-22 MED ORDER — FENTANYL CITRATE (PF) 100 MCG/2ML IJ SOLN
INTRAMUSCULAR | Status: DC | PRN
  Administered 2017-10-22: 100 ug via INTRAVENOUS

## 2017-10-22 MED ORDER — METHOCARBAMOL 1000 MG/10ML IJ SOLN
500.0000 mg | Freq: Four times a day (QID) | INTRAVENOUS | Status: DC | PRN
  Administered 2017-10-22: 500 mg via INTRAVENOUS
  Filled 2017-10-22: qty 550

## 2017-10-22 MED ORDER — FENTANYL CITRATE (PF) 100 MCG/2ML IJ SOLN
50.0000 ug | INTRAMUSCULAR | Status: DC
  Administered 2017-10-22: 50 ug via INTRAVENOUS

## 2017-10-22 MED ORDER — BUPIVACAINE HCL (PF) 0.75 % IJ SOLN
INTRAMUSCULAR | Status: DC | PRN
  Administered 2017-10-22: 2 mL via INTRATHECAL

## 2017-10-22 SURGICAL SUPPLY — 61 items
BAG DECANTER FOR FLEXI CONT (MISCELLANEOUS) IMPLANT
BAG ZIPLOCK 12X15 (MISCELLANEOUS) IMPLANT
BANDAGE ACE 4X5 VEL STRL LF (GAUZE/BANDAGES/DRESSINGS) ×2 IMPLANT
BANDAGE ACE 6X5 VEL STRL LF (GAUZE/BANDAGES/DRESSINGS) ×2 IMPLANT
BLADE SAG 18X100X1.27 (BLADE) ×2 IMPLANT
BLADE SAW SGTL 13.0X1.19X90.0M (BLADE) ×2 IMPLANT
BOWL SMART MIX CTS (DISPOSABLE) ×2 IMPLANT
CAP KNEE TOTAL 3 SIGMA ×2 IMPLANT
CEMENT HV SMART SET (Cement) ×4 IMPLANT
COVER SURGICAL LIGHT HANDLE (MISCELLANEOUS) ×2 IMPLANT
CUFF TOURN SGL QUICK 34 (TOURNIQUET CUFF) ×1
CUFF TRNQT CYL 34X4X40X1 (TOURNIQUET CUFF) ×1 IMPLANT
DECANTER SPIKE VIAL GLASS SM (MISCELLANEOUS) ×2 IMPLANT
DERMABOND ADVANCED (GAUZE/BANDAGES/DRESSINGS) ×1
DERMABOND ADVANCED .7 DNX12 (GAUZE/BANDAGES/DRESSINGS) ×1 IMPLANT
DRAPE U-SHAPE 47X51 STRL (DRAPES) ×2 IMPLANT
DRSG AQUACEL AG ADV 3.5X10 (GAUZE/BANDAGES/DRESSINGS) ×2 IMPLANT
DRSG TEGADERM 4X4.75 (GAUZE/BANDAGES/DRESSINGS) ×2 IMPLANT
DURAPREP 26ML APPLICATOR (WOUND CARE) ×4 IMPLANT
ELECT REM PT RETURN 15FT ADLT (MISCELLANEOUS) ×2 IMPLANT
EVACUATOR 1/8 PVC DRAIN (DRAIN) ×2 IMPLANT
GAUZE SPONGE 2X2 8PLY STRL LF (GAUZE/BANDAGES/DRESSINGS) ×1 IMPLANT
GLOVE BIO SURGEON STRL SZ7.5 (GLOVE) ×4 IMPLANT
GLOVE BIOGEL PI IND STRL 8 (GLOVE) ×8 IMPLANT
GLOVE BIOGEL PI INDICATOR 8 (GLOVE) ×8
GLOVE ECLIPSE 8.0 STRL XLNG CF (GLOVE) ×4 IMPLANT
GLOVE SURG ORTHO 9.0 STRL STRW (GLOVE) ×2 IMPLANT
GLOVE SURG SS PI 7.5 STRL IVOR (GLOVE) ×2 IMPLANT
GOWN STRL REUS W/TWL XL LVL3 (GOWN DISPOSABLE) ×8 IMPLANT
HANDPIECE INTERPULSE COAX TIP (DISPOSABLE) ×1
HOLDER FOLEY CATH W/STRAP (MISCELLANEOUS) IMPLANT
IMMOBILIZER KNEE 20 (SOFTGOODS) ×2
IMMOBILIZER KNEE 20 THIGH 36 (SOFTGOODS) ×1 IMPLANT
NDL SAFETY ECLIPSE 18X1.5 (NEEDLE) IMPLANT
NEEDLE HYPO 18GX1.5 SHARP (NEEDLE)
NS IRRIG 1000ML POUR BTL (IV SOLUTION) ×2 IMPLANT
PACK TOTAL KNEE CUSTOM (KITS) ×2 IMPLANT
POSITIONER SURGICAL ARM (MISCELLANEOUS) ×2 IMPLANT
SET HNDPC FAN SPRY TIP SCT (DISPOSABLE) ×1 IMPLANT
SET PAD KNEE POSITIONER (MISCELLANEOUS) ×2 IMPLANT
SPONGE GAUZE 2X2 STER 10/PKG (GAUZE/BANDAGES/DRESSINGS) ×1
SPONGE LAP 18X18 RF (DISPOSABLE) IMPLANT
SPONGE SURGIFOAM ABS GEL 100 (HEMOSTASIS) ×2 IMPLANT
STOCKINETTE 6  STRL (DRAPES) ×1
STOCKINETTE 6 STRL (DRAPES) ×1 IMPLANT
SUCTION FRAZIER HANDLE 12FR (TUBING) ×1
SUCTION TUBE FRAZIER 12FR DISP (TUBING) ×1 IMPLANT
SUT BONE WAX W31G (SUTURE) IMPLANT
SUT MNCRL AB 3-0 PS2 18 (SUTURE) ×2 IMPLANT
SUT VIC AB 1 CT1 27 (SUTURE) ×4
SUT VIC AB 1 CT1 27XBRD ANTBC (SUTURE) ×4 IMPLANT
SUT VIC AB 2-0 CT1 27 (SUTURE) ×2
SUT VIC AB 2-0 CT1 TAPERPNT 27 (SUTURE) ×2 IMPLANT
SUT VLOC 180 0 24IN GS25 (SUTURE) ×2 IMPLANT
SYR 3ML LL SCALE MARK (SYRINGE) IMPLANT
SYR 50ML LL SCALE MARK (SYRINGE) ×2 IMPLANT
TAPE STRIPS DRAPE STRL (GAUZE/BANDAGES/DRESSINGS) ×2 IMPLANT
TRAY FOLEY MTR SLVR 16FR STAT (SET/KITS/TRAYS/PACK) ×2 IMPLANT
WATER STERILE IRR 1000ML POUR (IV SOLUTION) ×4 IMPLANT
WRAP KNEE MAXI GEL POST OP (GAUZE/BANDAGES/DRESSINGS) ×2 IMPLANT
YANKAUER SUCT BULB TIP 10FT TU (MISCELLANEOUS) ×2 IMPLANT

## 2017-10-22 NOTE — Progress Notes (Signed)
Assisted Dr. Miller with left, ultrasound guided, adductor canal block. Side rails up, monitors on throughout procedure. See vital signs in flow sheet. Tolerated Procedure well.  

## 2017-10-22 NOTE — Anesthesia Preprocedure Evaluation (Signed)
Anesthesia Evaluation  Patient identified by MRN, date of birth, ID band Patient awake    Reviewed: Allergy & Precautions, NPO status , Patient's Chart, lab work & pertinent test results  History of Anesthesia Complications (+) PONV  Airway Mallampati: II  TM Distance: >3 FB Neck ROM: Full    Dental no notable dental hx.    Pulmonary neg pulmonary ROS,    Pulmonary exam normal breath sounds clear to auscultation       Cardiovascular negative cardio ROS Normal cardiovascular exam Rhythm:Regular Rate:Normal     Neuro/Psych  Headaches, negative neurological ROS  negative psych ROS   GI/Hepatic negative GI ROS, Neg liver ROS,   Endo/Other  negative endocrine ROS  Renal/GU negative Renal ROS  negative genitourinary   Musculoskeletal negative musculoskeletal ROS (+) Arthritis , Osteoarthritis,    Abdominal   Peds negative pediatric ROS (+)  Hematology negative hematology ROS (+)   Anesthesia Other Findings   Reproductive/Obstetrics negative OB ROS                             Anesthesia Physical Anesthesia Plan  ASA: II  Anesthesia Plan: Spinal   Post-op Pain Management:  Regional for Post-op pain   Induction: Intravenous  PONV Risk Score and Plan: 3 and Ondansetron, Dexamethasone, Midazolam and Treatment may vary due to age or medical condition  Airway Management Planned: Simple Face Mask  Additional Equipment:   Intra-op Plan:   Post-operative Plan:   Informed Consent: I have reviewed the patients History and Physical, chart, labs and discussed the procedure including the risks, benefits and alternatives for the proposed anesthesia with the patient or authorized representative who has indicated his/her understanding and acceptance.   Dental advisory given  Plan Discussed with: CRNA  Anesthesia Plan Comments:         Anesthesia Quick Evaluation

## 2017-10-22 NOTE — Interval H&P Note (Signed)
History and Physical Interval Note:  10/22/2017 1:15 PM  Catherine Rowe  has presented today for surgery, with the diagnosis of Left knee osteoarthritis  The various methods of treatment have been discussed with the patient and family. After consideration of risks, benefits and other options for treatment, the patient has consented to  Procedure(s): LEFT TOTAL KNEE ARTHROPLASTY (Left) as a surgical intervention .  The patient's history has been reviewed, patient examined, no change in status, stable for surgery.  I have reviewed the patient's chart and labs.  Questions were answered to the patient's satisfaction.     Rutledge Selsor ANDREW

## 2017-10-22 NOTE — Transfer of Care (Signed)
Immediate Anesthesia Transfer of Care Note  Patient: Catherine Rowe  Procedure(s) Performed: LEFT TOTAL KNEE ARTHROPLASTY (Left Knee)  Patient Location: PACU  Anesthesia Type:Spinal  Level of Consciousness: sedated, patient cooperative and responds to stimulation  Airway & Oxygen Therapy: Patient Spontanous Breathing and Patient connected to face mask oxygen  Post-op Assessment: Report given to RN and Post -op Vital signs reviewed and stable  Post vital signs: Reviewed and stable  Last Vitals:  Vitals Value Taken Time  BP    Temp    Pulse 91 10/22/2017  3:34 PM  Resp 14 10/22/2017  3:34 PM  SpO2 100 % 10/22/2017  3:34 PM  Vitals shown include unvalidated device data.  Last Pain:  Vitals:   10/22/17 1104  TempSrc:   PainSc: 2          Complications: No apparent anesthesia complications

## 2017-10-22 NOTE — Anesthesia Procedure Notes (Signed)
Spinal  Start time: 10/22/2017 1:25 PM End time: 10/22/2017 1:31 PM Staffing Resident/CRNA: Kizzie Fantasia, CRNA Performed: resident/CRNA  Preanesthetic Checklist Completed: patient identified, site marked, surgical consent, pre-op evaluation, timeout performed, IV checked, risks and benefits discussed and monitors and equipment checked Spinal Block Patient position: sitting Prep: Betadine Patient monitoring: heart rate, continuous pulse ox and blood pressure Approach: midline Location: L4-5 Injection technique: single-shot Needle Needle type: Quincke  Needle gauge: 22 G Needle length: 9 cm Needle insertion depth: 7 cm Additional Notes Pt sitting position, negative paresthesia/heme.

## 2017-10-22 NOTE — Anesthesia Procedure Notes (Signed)
Anesthesia Regional Block: Adductor canal block   Pre-Anesthetic Checklist: ,, timeout performed, Correct Patient, Correct Site, Correct Laterality, Correct Procedure, Correct Position, site marked, Risks and benefits discussed,  Surgical consent,  Pre-op evaluation,  At surgeon's request and post-op pain management  Laterality: Left  Prep: chloraprep       Needles:  Injection technique: Single-shot  Needle Type: Stimiplex     Needle Length: 9cm  Needle Gauge: 21     Additional Needles:   Procedures:,,,, ultrasound used (permanent image in chart),,,,  Narrative:  Start time: 10/22/2017 12:03 PM End time: 10/22/2017 12:08 PM Injection made incrementally with aspirations every 5 mL.  Performed by: Personally  Anesthesiologist: Lowella Curb, MD

## 2017-10-22 NOTE — Op Note (Signed)
DATE OF SURGERY:  10/22/2017  TIME: 3:15 PM  PATIENT NAME:  Catherine Rowe    AGE: 72 y.o.   PRE-OPERATIVE DIAGNOSIS:  Left knee osteoarthritis  POST-OPERATIVE DIAGNOSIS:  Left knee osteoarthritis  PROCEDURE:  Procedure(s): LEFT TOTAL KNEE ARTHROPLASTY  SURGEON:  Daquavion Catala ANDREW  ASSISTANT:  Bryson Stilwell, PA-C, present and scrubbed throughout the case, critical for assistance with exposure, retraction, instrumentation, and closure.  OPERATIVE IMPLANTS: Depuy PFC Sigma Rotating Platform.  Femur size 3, Tibia size 2.5, Patella size 32 3-peg oval button, with a 10 mm polyethylene insert.   PREOPERATIVE INDICATIONS:   Catherine Rowe is a 72 y.o. year old female with end stage bone on bone arthritis of the knee who failed conservative treatment and elected for Total Knee Arthroplasty.   The risks, benefits, and alternatives were discussed at length including but not limited to the risks of infection, bleeding, nerve injury, stiffness, blood clots, the need for revision surgery, cardiopulmonary complications, among others, and they were willing to proceed.  OPERATIVE DESCRIPTION:  The patient was brought to the operative room and placed in a supine position.  Spinal anesthesia was administered.  IV antibiotics were given.  The lower extremity was prepped and draped in the usual sterile fashion.  Time out was performed.  The leg was elevated and exsanguinated and the tourniquet was inflated.  Anterior quadriceps tendon splitting approach was performed.  The patella was retracted and osteophytes were removed.  The anterior horn of the medial and lateral meniscus was removed and cruciate ligaments resected.   The distal femur was opened with the drill and the intramedullary distal femoral cutting jig was utilized, set at 5 degrees resecting 10 mm off the distal femur.  Care was taken to protect the collateral ligaments.  The distal femoral sizing jig was applied, taking care to avoid  notching.  Then the 4-in-1 cutting jig was applied and the anterior and posterior femur was cut, along with the chamfer cuts.    Then the extramedullary tibial cutting jig was utilized making the appropriate cut using the anterior tibial crest as a reference building in appropriate posterior slope.  Care was taken during the cut to protect the medial and collateral ligaments.  The proximal tibia was removed along with the posterior horns of the menisci.   The posterior medial femoral osteophytes and posterior lateral femoral osteophytes were removed.    The flexion gap was then measured and was symmetric with the extension gap, measured at 10.  I completed the distal femoral preparation using the appropriate jig to prepare the box.  The patella was then measured, and cut with the saw.    The proximal tibia sized and prepared accordingly with the reamer and the punch, and then all components were trialed with the trial insert.  The knee was found to have excellent balance and full motion.    The above named components were then cemented into place and all excess cement was removed.  The trial polyethylene component was in place during cementation, and then was exchanged for the real polyethylene component.    The knee was easily taken through a range of motion and the patella tracked well and the knee irrigated copiously and the parapatellar and subcutaneous tissue closed with vicryl, and monocryl with steri strips for the skin.  The arthrotomy was closed at 90 of flexion. The wounds were dressed with sterile gauze and the tourniquet released and the patient was awakened and returned to the PACU in stable  and satisfactory condition.  There were no complications.  Total tourniquet time was 70 minutes.

## 2017-10-22 NOTE — Anesthesia Postprocedure Evaluation (Signed)
Anesthesia Post Note  Patient: Catherine Rowe  Procedure(s) Performed: LEFT TOTAL KNEE ARTHROPLASTY (Left Knee)     Patient location during evaluation: PACU Anesthesia Type: Spinal Level of consciousness: oriented and awake and alert Pain management: pain level controlled Vital Signs Assessment: post-procedure vital signs reviewed and stable Respiratory status: spontaneous breathing and respiratory function stable Cardiovascular status: blood pressure returned to baseline and stable Postop Assessment: no headache, no backache and no apparent nausea or vomiting Anesthetic complications: no    Last Vitals:  Vitals:   10/22/17 1729 10/22/17 1733  BP: (!) 106/56 106/60  Pulse: 79 80  Resp: 16 16  Temp:  36.8 C  SpO2: 100% 100%    Last Pain:  Vitals:   10/22/17 1733  TempSrc: Oral  PainSc:                  Lowella Curb

## 2017-10-23 LAB — CBC
HEMATOCRIT: 32.7 % — AB (ref 36.0–46.0)
HEMOGLOBIN: 10.7 g/dL — AB (ref 12.0–15.0)
MCH: 29.4 pg (ref 26.0–34.0)
MCHC: 32.7 g/dL (ref 30.0–36.0)
MCV: 89.8 fL (ref 78.0–100.0)
PLATELETS: 216 10*3/uL (ref 150–400)
RBC: 3.64 MIL/uL — AB (ref 3.87–5.11)
RDW: 13.1 % (ref 11.5–15.5)
WBC: 12.6 10*3/uL — AB (ref 4.0–10.5)

## 2017-10-23 LAB — BASIC METABOLIC PANEL
ANION GAP: 9 (ref 5–15)
BUN: 9 mg/dL (ref 6–20)
CHLORIDE: 104 mmol/L (ref 101–111)
CO2: 24 mmol/L (ref 22–32)
Calcium: 8.8 mg/dL — ABNORMAL LOW (ref 8.9–10.3)
Creatinine, Ser: 0.66 mg/dL (ref 0.44–1.00)
GFR calc Af Amer: >60 mL/min (ref >60)
GFR calc non Af Amer: >60 mL/min (ref >60)
Glucose, Bld: 120 mg/dL — ABNORMAL HIGH (ref 65–99)
POTASSIUM: 4.2 mmol/L (ref 3.5–5.1)
Sodium: 137 mmol/L (ref 135–145)

## 2017-10-23 MED ORDER — HYDROMORPHONE HCL 2 MG PO TABS
2.0000 mg | ORAL_TABLET | ORAL | Status: DC | PRN
  Administered 2017-10-23 – 2017-10-25 (×11): 2 mg via ORAL
  Filled 2017-10-23 (×11): qty 1

## 2017-10-23 MED ORDER — HYDROMORPHONE HCL 2 MG PO TABS: 2.0000 mg | ORAL_TABLET | ORAL | 0 refills | Status: AC | PRN

## 2017-10-23 MED ORDER — ACETAMINOPHEN 500 MG PO TABS: 1000.0000 mg | ORAL_TABLET | Freq: Four times a day (QID) | ORAL | 0 refills | Status: AC

## 2017-10-23 MED ORDER — ZOLPIDEM TARTRATE 5 MG PO TABS
5.0000 mg | ORAL_TABLET | Freq: Every evening | ORAL | Status: DC | PRN
  Administered 2017-10-23 – 2017-10-25 (×2): 5 mg via ORAL
  Filled 2017-10-23 (×2): qty 1

## 2017-10-23 MED ORDER — ONDANSETRON HCL 4 MG PO TABS: 4.0000 mg | ORAL_TABLET | Freq: Four times a day (QID) | ORAL | 0 refills | Status: AC | PRN

## 2017-10-23 NOTE — Evaluation (Signed)
Physical Therapy Evaluation Patient Details Name: Catherine Rowe MRN: [id number] DOB: June 21, 1945 Today's Date: 10/23/2017   History of Present Illness  Pt s/p L TKR and with hx of bil RCR and bil TMJ arthroplasty  Clinical Impression  Pt s/p L TKR and presents with decreased L LE strength/ROM and post op pain limiting functional mobility.  Pt should progress well to dc home.    Follow Up Recommendations Home health PT    Equipment Recommendations  3in1 (PT)    Recommendations for Other Services       Precautions / Restrictions Precautions Precautions: Fall;Knee Required Braces or Orthoses: Knee Immobilizer - Left Knee Immobilizer - Left: Discontinue once straight leg raise with < 10 degree lag Restrictions Weight Bearing Restrictions: No Other Position/Activity Restrictions: WBAT      Mobility  Bed Mobility Overal bed mobility: Needs Assistance Bed Mobility: Supine to Sit     Supine to sit: Min assist     General bed mobility comments: cues for sequence and use of R LE to self assist  Transfers Overall transfer level: Needs assistance Equipment used: Rolling walker (2 wheeled) Transfers: Sit to/from Stand Sit to Stand: Min assist         General transfer comment: cues for LE management and use of UEs to self assist  Ambulation/Gait Ambulation/Gait assistance: Min assist Ambulation Distance (Feet): 75 Feet Assistive device: Rolling walker (2 wheeled) Gait Pattern/deviations: Step-to pattern;Decreased step length - right;Decreased step length - left;Shuffle;Trunk flexed Gait velocity: decr   General Gait Details: cues for sequence, posture and position from [employer]            Wheelchair Mobility    Modified Rankin (Stroke Patients Only)       Balance Overall balance assessment: No apparent balance deficits (not formally assessed)                                           Pertinent Vitals/Pain Pain Assessment: 0-10 Pain  Score: 4  Pain Location: L knee Pain Descriptors / Indicators: Aching;Burning;Sore Pain Intervention(s): Limited activity within patient's tolerance;Monitored during session;Premedicated before session;Ice applied    Home Living Family/patient expects to be discharged to:: Private residence Living Arrangements: Alone Available Help at Discharge: Family;Available PRN/intermittently Type of Home: House Home Access: Stairs to enter Entrance Stairs-Rails: None Entrance Stairs-Number of Steps: 1+1 Home Layout: One level Home Equipment: Walker - 2 wheels;Cane - single point;Shower seat      Prior Function Level of Independence: Independent               Hand Dominance        Extremity/Trunk Assessment   Upper Extremity Assessment Upper Extremity Assessment: Overall WFL for tasks assessed    Lower Extremity Assessment Lower Extremity Assessment: LLE deficits/detail    Cervical / Trunk Assessment Cervical / Trunk Assessment: Normal  Communication   Communication: No difficulties  Cognition Arousal/Alertness: Awake/alert Behavior During Therapy: WFL for tasks assessed/performed Overall Cognitive Status: Within Functional Limits for tasks assessed                                        General Comments      Exercises     Assessment/Plan    PT Assessment Patient needs continued PT services  PT Problem  List Decreased strength;Decreased range of motion;Decreased activity tolerance;Decreased mobility;Decreased knowledge of use of DME;Pain       PT Treatment Interventions DME instruction;Gait training;Stair training;Functional mobility training;Therapeutic activities;Therapeutic exercise;Patient/family education    PT Goals (Current goals can be found in the Care Plan section)  Acute Rehab PT Goals Patient Stated Goal: Regain IND PT Goal Formulation: With patient Time For Goal Achievement: 10/30/17 Potential to Achieve Goals: Good    Frequency  7X/week   Barriers to discharge        Co-evaluation               AM-PAC PT "6 Clicks" Daily Activity  Outcome Measure Difficulty turning over in bed (including adjusting bedclothes, sheets and blankets)?: A Lot Difficulty moving from lying on back to sitting on the side of the bed? : A Lot Difficulty sitting down on and standing up from a chair with arms (e.g., wheelchair, bedside commode, etc,.)?: A Lot Help needed moving to and from a bed to chair (including a wheelchair)?: A Little Help needed walking in hospital room?: A Little Help needed climbing 3-5 steps with a railing? : A Little 6 Click Score: 15    End of Session Equipment Utilized During Treatment: Gait belt;Left knee immobilizer Activity Tolerance: Patient tolerated treatment well Patient left: in chair;with call bell/phone within reach Nurse Communication: Mobility status PT Visit Diagnosis: Difficulty in walking, not elsewhere classified (R26.2)    Time: 0825-0900 PT Time Calculation (min) (ACUTE ONLY): 35 min   Charges:   PT Evaluation $PT Eval Low Complexity: 1 Low PT Treatments $Gait Training: 8-22 mins   PT G Codes:        Pg [phone]   Kennedy Bohanon 10/23/2017, 9:10 AM

## 2017-10-23 NOTE — Progress Notes (Signed)
Physical Therapy Treatment Patient Details Name: Catherine Rowe MRN: [id number] DOB: 1945-11-22 Today's Date: 10/23/2017    History of Present Illness Pt s/p L TKR and with hx of bil RCR and bil TMJ arthroplasty    PT Comments    Pt continues motivated and progressing well with mobility.    Follow Up Recommendations  Home health PT     Equipment Recommendations  3in1 (PT)    Recommendations for Other Services       Precautions / Restrictions Precautions Precautions: Fall;Knee Required Braces or Orthoses: Knee Immobilizer - Left Knee Immobilizer - Left: Discontinue once straight leg raise with < 10 degree lag(pt performed IND SLR) Restrictions Weight Bearing Restrictions: No Other Position/Activity Restrictions: WBAT    Mobility  Bed Mobility               General bed mobility comments: Pt up in chair and requests back to same  Transfers Overall transfer level: Needs assistance Equipment used: Rolling walker (2 wheeled) Transfers: Sit to/from Stand Sit to Stand: Min guard         General transfer comment: cues for LE management and use of UEs to self assist  Ambulation/Gait Ambulation/Gait assistance: Min guard Ambulation Distance (Feet): 120 Feet Assistive device: Rolling walker (2 wheeled) Gait Pattern/deviations: Step-to pattern;Decreased step length - right;Decreased step length - left;Shuffle;Trunk flexed Gait velocity: decr   General Gait Details: cues for sequence, posture and position from [employer]             Wheelchair Mobility    Modified Rankin (Stroke Patients Only)       Balance Overall balance assessment: No apparent balance deficits (not formally assessed)                                          Cognition Arousal/Alertness: Awake/alert Behavior During Therapy: WFL for tasks assessed/performed Overall Cognitive Status: Within Functional Limits for tasks assessed                                         Exercises Total Joint Exercises Ankle Circles/Pumps: AROM;Both;15 reps;Supine Quad Sets: AROM;Both;10 reps;Supine Heel Slides: AAROM;Left;15 reps;Supine Straight Leg Raises: AAROM;AROM;Left;15 reps;Supine Goniometric ROM: AAROM L knee -10 - 60    General Comments        Pertinent Vitals/Pain Pain Assessment: 0-10 Pain Score: 4  Pain Location: L knee Pain Descriptors / Indicators: Aching;Burning;Sore Pain Intervention(s): Limited activity within patient's tolerance;Monitored during session;Premedicated before session;Ice applied    Home Living                      Prior Function            PT Goals (current goals can now be found in the care plan section) Acute Rehab PT Goals Patient Stated Goal: Regain IND PT Goal Formulation: With patient Time For Goal Achievement: 10/30/17 Potential to Achieve Goals: Good Progress towards PT goals: Progressing toward goals    Frequency    7X/week      PT Plan Current plan remains appropriate    Co-evaluation              AM-PAC PT "6 Clicks" Daily Activity  Outcome Measure  Difficulty turning over in bed (including adjusting bedclothes, sheets and  blankets)?: A Lot Difficulty moving from lying on back to sitting on the side of the bed? : A Lot Difficulty sitting down on and standing up from a chair with arms (e.g., wheelchair, bedside commode, etc,.)?: A Lot Help needed moving to and from a bed to chair (including a wheelchair)?: A Little Help needed walking in hospital room?: A Little Help needed climbing 3-5 steps with a railing? : A Little 6 Click Score: 15    End of Session Equipment Utilized During Treatment: Gait belt Activity Tolerance: Patient tolerated treatment well Patient left: in chair;with call bell/phone within reach Nurse Communication: Mobility status PT Visit Diagnosis: Difficulty in walking, not elsewhere classified (R26.2)     Time: 1335-1350 PT Time  Calculation (min) (ACUTE ONLY): 15 min  Charges:  $Gait Training: 8-22 mins $Therapeutic Exercise: 8-22 mins                    G Codes:       Pg [phone]    Alianny Toelle 10/23/2017, 3:59 PM

## 2017-10-23 NOTE — Discharge Summary (Signed)
Physician Discharge Summary  Patient ID: Catherine Rowe MRN: [id number] DOB/AGE: February 14, 1946 72 y.o.  Admit date: 10/22/2017 Discharge date: 10/23/2017  Admission Diagnoses: Left knee OA  Discharge Diagnoses:  Active Problems:   Primary osteoarthritis of left knee   S/P knee replacement   Discharged Condition: good  Hospital Course:  Makailah Slavick is a 72 y.o. who was admitted to [facility]. They were brought to the operating room on 10/22/2017 and underwent Procedure(s): LEFT TOTAL KNEE ARTHROPLASTY.  Patient tolerated the procedure well and was later transferred to the recovery room and then to the orthopaedic floor for postoperative care.  They were given PO and IV analgesics for pain control following their surgery.  They were given 24 hours of postoperative antibiotics of  Anti-infectives (From admission, onward)   Start     Dose/Rate Route Frequency Ordered Stop   10/22/17 2000  ceFAZolin (ANCEF) IVPB 2g/100 mL premix     2 g 200 mL/hr over 30 Minutes Intravenous Every 6 hours 10/22/17 1702 10/23/17 0136   10/22/17 1045  ceFAZolin (ANCEF) IVPB 2g/100 mL premix     2 g 200 mL/hr over 30 Minutes Intravenous On call to O.R. 10/22/17 1041 10/22/17 1417     and started on DVT prophylaxis in the form of Aspirin and Lovenox.   PT and OT were ordered for total joint protocol.  Discharge planning consulted to help with postop disposition and equipment needs.  Patient had a good night on the evening of surgery and started to get up OOB with therapy on day one.  Hemovac drain was pulled without difficulty.  Continued to work with therapy into day two.  Dressing was with normal limits.  The patient had progressed with therapy and meeting their goals. Patient was seen in rounds and was ready to go home.  Consults: None  Significant Diagnostic Studies: labs:    Treatments: routine  Discharge Exam: Blood pressure (!) 104/53, pulse 67, temperature 98 F (36.7 C), temperature source  Oral, resp. rate 16, height 5' 1.5" (1.562 m), weight 75.8 kg (167 lb), SpO2 100 %. Alert and oriented x3. RRR, Lungs clear, BS x4. Left Calf soft and non tender. L knee dressing C/D/I. No DVT signs. No signs of infection or compartment syndrome. LLE grossly neurovascularly intact.   Disposition:   Discharge Instructions    Call MD / Call 911   Complete by:  As directed    If you experience chest pain or shortness of breath, CALL 911 and be transported to the hospital emergency room.  If you develope a fever above 101 F, pus (white drainage) or increased drainage or redness at the wound, or calf pain, call your surgeon's office.   Constipation Prevention   Complete by:  As directed    Drink plenty of fluids.  Prune juice may be helpful.  You may use a stool softener, such as Colace (over the counter) 100 mg twice a day.  Use MiraLax (over the counter) for constipation as needed.   Diet - low sodium heart healthy   Complete by:  As directed    Discharge instructions   Complete by:  As directed    INSTRUCTIONS AFTER JOINT REPLACEMENT   Remove items at home which could result in a fall. This includes throw rugs or furniture in walking pathways ICE to the affected joint every three hours while awake for 30 minutes at a time, for at least the first 3-5 days, and then as needed for pain  and swelling.  Continue to use ice for pain and swelling. You may notice swelling that will progress down to the foot and ankle.  This is normal after surgery.  Elevate your leg when you are not up walking on it.   Continue to use the breathing machine you got in the hospital (incentive spirometer) which will help keep your temperature down.  It is common for your temperature to cycle up and down following surgery, especially at night when you are not up moving around and exerting yourself.  The breathing machine keeps your lungs expanded and your temperature down.   DIET:  As you were doing prior to  hospitalization, we recommend a well-balanced diet.  DRESSING / WOUND CARE / SHOWERING  Keep the surgical dressing until follow up.  The dressing is water proof, so you can shower without any extra covering.  IF THE DRESSING FALLS OFF or the wound gets wet inside, change the dressing with sterile gauze.  Please use good hand washing techniques before changing the dressing.  Do not use any lotions or creams on the incision until instructed by your surgeon.    ACTIVITY  Increase activity slowly as tolerated, but follow the weight bearing instructions below.   No driving for 6 weeks or until further direction given by your physician.  You cannot drive while taking narcotics.  No lifting or carrying greater than 10 lbs. until further directed by your surgeon. Avoid periods of inactivity such as sitting longer than an hour when not asleep. This helps prevent blood clots.  You may return to work once you are authorized by your doctor.     WEIGHT BEARING   Weight bearing as tolerated with assist device (walker, cane, etc) as directed, use it as long as suggested by your surgeon or therapist, typically at least 4-6 weeks.   EXERCISES  Results after joint replacement surgery are often greatly improved when you follow the exercise, range of motion and muscle strengthening exercises prescribed by your doctor. Safety measures are also important to protect the joint from further injury. Any time any of these exercises cause you to have increased pain or swelling, decrease what you are doing until you are comfortable again and then slowly increase them. If you have problems or questions, call your caregiver or physical therapist for advice.   Rehabilitation is important following a joint replacement. After just a few days of immobilization, the muscles of the leg can become weakened and shrink (atrophy).  These exercises are designed to build up the tone and strength of the thigh and leg muscles and to  improve motion. Often times heat used for twenty to thirty minutes before working out will loosen up your tissues and help with improving the range of motion but do not use heat for the first two weeks following surgery (sometimes heat can increase post-operative swelling).   These exercises can be done on a training (exercise) mat, on the floor, on a table or on a bed. Use whatever works the best and is most comfortable for you.    Use music or television while you are exercising so that the exercises are a pleasant break in your day. This will make your life better with the exercises acting as a break in your routine that you can look forward to.   Perform all exercises about fifteen times, three times per day or as directed.  You should exercise both the operative leg and the other leg as well.  Exercises include:   Quad Sets - Tighten up the muscle on the front of the thigh (Quad) and hold for 5-10 seconds.   Straight Leg Raises - With your knee straight (if you were given a brace, keep it on), lift the leg to 60 degrees, hold for 3 seconds, and slowly lower the leg.  Perform this exercise against resistance later as your leg gets stronger.  Leg Slides: Lying on your back, slowly slide your foot toward your buttocks, bending your knee up off the floor (only go as far as is comfortable). Then slowly slide your foot back down until your leg is flat on the floor again.  Angel Wings: Lying on your back spread your legs to the side as far apart as you can without causing discomfort.  Hamstring Strength:  Lying on your back, push your heel against the floor with your leg straight by tightening up the muscles of your buttocks.  Repeat, but this time bend your knee to a comfortable angle, and push your heel against the floor.  You may put a pillow under the heel to make it more comfortable if necessary.   A rehabilitation program following joint replacement surgery can speed recovery and prevent re-injury  in the future due to weakened muscles. Contact your doctor or a physical therapist for more information on knee rehabilitation.    CONSTIPATION  Constipation is defined medically as fewer than three stools per week and severe constipation as less than one stool per week.  Even if you have a regular bowel pattern at home, your normal regimen is likely to be disrupted due to multiple reasons following surgery.  Combination of anesthesia, postoperative narcotics, change in appetite and fluid intake all can affect your bowels.   YOU MUST use at least one of the following options; they are listed in order of increasing strength to get the job done.  They are all available over the counter, and you may need to use some, POSSIBLY even all of these options:    Drink plenty of fluids (prune juice may be helpful) and high fiber foods Colace 100 mg by mouth twice a day  Senokot for constipation as directed and as needed Dulcolax (bisacodyl), take with full glass of water  Miralax (polyethylene glycol) once or twice a day as needed.  If you have tried all these things and are unable to have a bowel movement in the first 3-4 days after surgery call either your surgeon or your primary doctor.    If you experience loose stools or diarrhea, hold the medications until you stool forms back up.  If your symptoms do not get better within 1 week or if they get worse, check with your doctor.  If you experience "the worst abdominal pain ever" or develop nausea or vomiting, please contact the office immediately for further recommendations for treatment.   ITCHING:  If you experience itching with your medications, try taking only a single pain pill, or even half a pain pill at a time.  You can also use Benadryl over the counter for itching or also to help with sleep.   TED HOSE STOCKINGS:  Use stockings on both legs until for at least 2 weeks or as directed by physician office. They may be removed at night for  sleeping.  MEDICATIONS:  See your medication summary on the "After Visit Summary" that nursing will review with you.  You may have some home medications which will be placed on hold until  you complete the course of blood thinner medication.  It is important for you to complete the blood thinner medication as prescribed.  PRECAUTIONS:  If you experience chest pain or shortness of breath - call 911 immediately for transfer to the hospital emergency department.   If you develop a fever greater that 101 F, purulent drainage from wound, increased redness or drainage from wound, foul odor from the wound/dressing, or calf pain - CONTACT YOUR SURGEON.                                                   FOLLOW-UP APPOINTMENTS:  If you do not already have a post-op appointment, please call the office for an appointment to be seen by your surgeon.  Guidelines for how soon to be seen are listed in your "After Visit Summary", but are typically between 1-4 weeks after surgery.  OTHER INSTRUCTIONS:   Knee Replacement:  Do not place pillow under knee, focus on keeping the knee straight while resting. CPM instructions: 0-90 degrees, 2 hours in the morning, 2 hours in the afternoon, and 2 hours in the evening. Place foam block, curve side up under heel at all times except when in CPM or when walking.  DO NOT modify, tear, cut, or change the foam block in any way.  MAKE SURE YOU:  Understand these instructions.  Get help right away if you are not doing well or get worse.    Thank you for letting us be a part of your medical care team.  It is a privilege we respect greatly.  We hope these instructions will help you stay on track for a fast and full recovery!   Increase activity slowly as tolerated   Complete by:  As directed      Allergies as of 10/23/2017   No Known Allergies     Medication List    TAKE these medications   acetaminophen 500 MG tablet Commonly known as:  TYLENOL Take 2 tablets (1,000 mg  total) by mouth every 6 (six) hours.   aspirin EC 325 MG tablet Take 1 tablet (325 mg total) by mouth 2 (two) times daily.   Biotin 5000 MCG Tabs Take 1 tablet by mouth daily.   CALCIUM 600 + D PO Take 2 tablets by mouth daily.   cholecalciferol 1000 units tablet Commonly known as:  VITAMIN D Take 1,000 Units by mouth daily.   GLUCOSAMINE CHOND COMPLEX/MSM PO Take 1 tablet by mouth 2 (two) times daily.   HYDROmorphone 2 MG tablet Commonly known as:  DILAUDID Take 1 tablet (2 mg total) by mouth every 4 (four) hours as needed for up to 7 days for severe pain.   methocarbamol 500 MG tablet Commonly known as:  ROBAXIN Take 1 tablet (500 mg total) by mouth every 8 (eight) hours as needed for muscle spasms.   multivitamin with minerals Tabs tablet Take 1 tablet by mouth daily.   ondansetron 4 MG tablet Commonly known as:  ZOFRAN Take 1 tablet (4 mg total) by mouth every 6 (six) hours as needed for nausea.   vitamin B-12 500 MCG tablet Commonly known as:  CYANOCOBALAMIN Take 500 mcg by mouth daily.   vitamin C 500 MG tablet Commonly known as:  ASCORBIC ACID Take 500 mg by mouth daily.        Signed:  Kayloni Rocco L 10/23/2017, 9:26 AM

## 2017-10-23 NOTE — Progress Notes (Signed)
Physical Therapy Treatment Patient Details Name: Catherine Rowe MRN: [id number] DOB: April 21, 1946 Today's Date: 10/23/2017    History of Present Illness Pt s/p L TKR and with hx of bil RCR and bil TMJ arthroplasty    PT Comments    Pt initiated therex program   Follow Up Recommendations  Home health PT     Equipment Recommendations  3in1 (PT)    Recommendations for Other Services       Precautions / Restrictions Precautions Precautions: Fall;Knee Required Braces or Orthoses: Knee Immobilizer - Left Knee Immobilizer - Left: Discontinue once straight leg raise with < 10 degree lag(pt performed IND SLR) Restrictions Weight Bearing Restrictions: No Other Position/Activity Restrictions: WBAT    Mobility  Bed Mobility Overal bed mobility: Needs Assistance Bed Mobility: Supine to Sit     Supine to sit: Min assist     General bed mobility comments: cues for sequence and use of R LE to self assist  Transfers Overall transfer level: Needs assistance Equipment used: Rolling walker (2 wheeled) Transfers: Sit to/from Stand Sit to Stand: Min assist         General transfer comment: cues for LE management and use of UEs to self assist  Ambulation/Gait Ambulation/Gait assistance: Min assist Ambulation Distance (Feet): 75 Feet Assistive device: Rolling walker (2 wheeled) Gait Pattern/deviations: Step-to pattern;Decreased step length - right;Decreased step length - left;Shuffle;Trunk flexed Gait velocity: decr   General Gait Details: cues for sequence, posture and position from [employer]             Wheelchair Mobility    Modified Rankin (Stroke Patients Only)       Balance Overall balance assessment: No apparent balance deficits (not formally assessed)                                          Cognition Arousal/Alertness: Awake/alert Behavior During Therapy: WFL for tasks assessed/performed Overall Cognitive Status: Within Functional  Limits for tasks assessed                                        Exercises Total Joint Exercises Ankle Circles/Pumps: AROM;Both;15 reps;Supine Quad Sets: AROM;Both;10 reps;Supine Heel Slides: AAROM;Left;15 reps;Supine Straight Leg Raises: AAROM;AROM;Left;15 reps;Supine Goniometric ROM: AAROM L knee -10 - 60    General Comments        Pertinent Vitals/Pain Pain Assessment: 0-10 Pain Score: 4  Pain Location: L knee Pain Descriptors / Indicators: Aching;Burning;Sore Pain Intervention(s): Limited activity within patient's tolerance;Monitored during session;Premedicated before session;Ice applied    Home Living Family/patient expects to be discharged to:: Private residence Living Arrangements: Alone Available Help at Discharge: Family;Available PRN/intermittently Type of Home: House Home Access: Stairs to enter Entrance Stairs-Rails: None Home Layout: One level Home Equipment: Environmental consultant - 2 wheels;Cane - single point;Shower seat      Prior Function Level of Independence: Independent          PT Goals (current goals can now be found in the care plan section) Acute Rehab PT Goals Patient Stated Goal: Regain IND PT Goal Formulation: With patient Time For Goal Achievement: 10/30/17 Potential to Achieve Goals: Good Progress towards PT goals: Progressing toward goals    Frequency    7X/week      PT Plan Current plan remains appropriate  Co-evaluation              AM-PAC PT "6 Clicks" Daily Activity  Outcome Measure  Difficulty turning over in bed (including adjusting bedclothes, sheets and blankets)?: A Lot Difficulty moving from lying on back to sitting on the side of the bed? : A Lot Difficulty sitting down on and standing up from a chair with arms (e.g., wheelchair, bedside commode, etc,.)?: A Lot Help needed moving to and from a bed to chair (including a wheelchair)?: A Little Help needed walking in hospital room?: A Little Help needed  climbing 3-5 steps with a railing? : A Little 6 Click Score: 15    End of Session Equipment Utilized During Treatment: Gait belt;Left knee immobilizer Activity Tolerance: Patient tolerated treatment well Patient left: in chair;with call bell/phone within reach Nurse Communication: Mobility status PT Visit Diagnosis: Difficulty in walking, not elsewhere classified (R26.2)     Time: 1610-9604 PT Time Calculation (min) (ACUTE ONLY): 17 min  Charges:  $Gait Training: 8-22 mins $Therapeutic Exercise: 8-22 mins                    G Codes:       Pg [phone]    Willma Obando 10/23/2017, 12:55 PM

## 2017-10-23 NOTE — Progress Notes (Signed)
Subjective: 1 Day Post-Op Procedure(s) (LRB): LEFT TOTAL KNEE ARTHROPLASTY (Left) Patient reports pain as mild.  tolerating Po's. Reports a good night. Denies SOB or CP.  Objective: Vital signs in last 24 hours: Temp:  [97.4 F (36.3 C)-98.3 F (36.8 C)] 98 F (36.7 C) (05/25 0616) Pulse Rate:  [58-98] 67 (05/25 0616) Resp:  [8-21] 16 (05/25 0616) BP: (97-169)/(49-79) 104/53 (05/25 0616) SpO2:  [95 %-100 %] 100 % (05/25 0616) Weight:  [75.8 kg (167 lb)] 75.8 kg (167 lb) (05/24 1104)  Intake/Output from previous day: 05/24 0701 - 05/25 0700 In: 4004.2 [P.O.:960; I.V.:2994.2; IV Piggyback:50] Out: 4010 [Urine:3850; Drains:110; Blood:50] Intake/Output this shift: No intake/output data recorded.  Recent Labs    10/23/17 0501  HGB 10.7*   Recent Labs    10/23/17 0501  WBC 12.6*  RBC 3.64*  HCT 32.7*  PLT 216   Recent Labs    10/23/17 0501  NA 137  K 4.2  CL 104  CO2 24  BUN 9  CREATININE 0.66  GLUCOSE 120*  CALCIUM 8.8*   No results for input(s): LABPT, INR in the last 72 hours.  Alert and oriented x3. RRR, Lungs clear, BS x4. Left Calf soft and non tender. L knee dressing C/D/I. No DVT signs. No signs of infection or compartment syndrome. LLE grossly neurovascularly intact.   Anticipated LOS equal to or greater than 2 midnights due to - Age 72 and older with one or more of the following:  - Obesity  - Expected need for hospital services (PT, OT, Nursing) required for safe  discharge  - Anticipated need for postoperative skilled nursing care or inpatient rehab  - Active co-morbidities: None OR   - Unanticipated findings during/Post Surgery: None  - Patient is a high risk of re-admission due to: None   Assessment/Plan: 1 Day Post-Op Procedure(s) (LRB): LEFT TOTAL KNEE ARTHROPLASTY (Left) Up with therapy D/C IV fluids Plan for discharge tomorrow Discharge home with home health  Drain D/c with tip intact    Tanajah Boulter L 10/23/2017, 8:22 AM

## 2017-10-24 LAB — CBC
HEMATOCRIT: 31.8 % — AB (ref 36.0–46.0)
Hemoglobin: 10.2 g/dL — ABNORMAL LOW (ref 12.0–15.0)
MCH: 29.6 pg (ref 26.0–34.0)
MCHC: 32.1 g/dL (ref 30.0–36.0)
MCV: 92.2 fL (ref 78.0–100.0)
Platelets: 218 10*3/uL (ref 150–400)
RBC: 3.45 MIL/uL — ABNORMAL LOW (ref 3.87–5.11)
RDW: 13.5 % (ref 11.5–15.5)
WBC: 12 10*3/uL — AB (ref 4.0–10.5)

## 2017-10-24 MED ORDER — ASPIRIN 325 MG PO TABS
325.0000 mg | ORAL_TABLET | Freq: Once | ORAL | Status: AC
  Administered 2017-10-24: 325 mg via ORAL
  Filled 2017-10-24: qty 1

## 2017-10-24 MED ORDER — ASPIRIN 325 MG PO TABS
325.0000 mg | ORAL_TABLET | Freq: Two times a day (BID) | ORAL | Status: DC
  Administered 2017-10-24 – 2017-10-25 (×2): 325 mg via ORAL
  Filled 2017-10-24 (×2): qty 1

## 2017-10-24 NOTE — Care Management Note (Addendum)
Case Management Note  Patient Details  Name: Deryn Massengale MRN: [id number] Date of Birth: 31-Dec-1945  Subjective/Objective:  Left TKA                  Action/Plan: NCM spoke to pt and offered choice for [facility]. Pt agreeable to [facility] for HHPT. Requested 3n1 bedside commode for home. Contacted [facility] with DME request. DME will be delivered to room prior to dc. Pt has RW at home.  Expected Discharge Date:  10/24/17               Expected Discharge Plan:  Home w Home Health Services  In-House Referral:  NA  Discharge planning Services  CM Consult  Post Acute Care Choice:  Home Health Choice offered to:  Patient  DME Arranged:  3n1 DME Agency:  [facility]  HH Arranged:  PT [facility] Agency:  [facility]  Status of Service:  Completed, signed off  If discussed at Long Length of Stay Meetings, dates discussed:    Additional Comments:  Elliot Cousin, RN 10/24/2017, 8:28 AM

## 2017-10-24 NOTE — Progress Notes (Signed)
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  Applied KI and instructed on use.  Assisted OOB to amb to bathroom was difficult due to pain level and emotional state.  Assisted in bathroom then when at doorway, pt started to tremor and c/o increased dizziness/nausea.  NT assisted as well to get pt to recliner.  Vitals taken all WNL.  Pt present with anxiety and emotionally overwhelmed.  Offered comfort and applied ICE.    Follow Up Recommendations  Home health PT     Equipment Recommendations  3in1 (PT)    Recommendations for Other Services       Precautions / Restrictions Precautions Precautions: Fall;Knee Precaution Comments: instructed on KI use for amb  Required Braces or Orthoses: Knee Immobilizer - Left Knee Immobilizer - Left: Discontinue once straight leg raise with < 10 degree lag Restrictions Weight Bearing Restrictions: No Other Position/Activity Restrictions: WBAT    Mobility  Bed Mobility Overal bed mobility: Needs Assistance Bed Mobility: Supine to Sit     Supine to sit: Min assist     General bed mobility comments: assist L LE and increased time  Transfers Overall transfer level: Needs assistance Equipment used: Rolling walker (2 wheeled) Transfers: Sit to/from [employer] Sit to Stand: Min guard;Min assist Stand pivot transfers: Min guard;Min assist       General transfer comment: cues for LE management and use of UEs to self assist    also assisted with toilet transfer   Ambulation/Gait Ambulation/Gait assistance: Min guard;Min assist Ambulation Distance (Feet): 19 Feet(8 feet x 2 to and from bathroom only) Assistive device: Rolling walker (2 wheeled) Gait Pattern/deviations: Step-to pattern;Decreased step  length - right;Decreased step length - left;Shuffle;Trunk flexed Gait velocity: decr   General Gait Details: cues for sequence, posture and position from [employer]             Wheelchair Mobility    Modified Rankin (Stroke Patients Only)       Balance                                            Cognition Arousal/Alertness: Awake/alert Behavior During Therapy: WFL for tasks assessed/performed                                   General Comments: pt emotional, crying , moaning      Exercises      General Comments        Pertinent Vitals/Pain Pain Assessment: 0-10 Pain Score: 10-Worst pain ever Pain Location: L knee Pain Descriptors / Indicators: Aching;Burning;Sore;Crying Pain Intervention(s): Monitored during session;Repositioned;Ice applied;Premedicated before session;Patient requesting pain meds-RN notified    Home Living                      Prior Function            PT Goals (current goals can now be found in the care plan section) Progress towards PT goals: Progressing toward goals    Frequency    7X/week  PT Plan Current plan remains appropriate    Co-evaluation              AM-PAC PT "6 Clicks" Daily Activity  Outcome Measure  Difficulty turning over in bed (including adjusting bedclothes, sheets and blankets)?: A Lot Difficulty moving from lying on back to sitting on the side of the bed? : A Lot Difficulty sitting down on and standing up from a chair with arms (e.g., wheelchair, bedside commode, etc,.)?: A Lot Help needed moving to and from a bed to chair (including a wheelchair)?: A Lot Help needed walking in hospital room?: A Lot Help needed climbing 3-5 steps with a railing? : Total 6 Click Score: 11    End of Session Equipment Utilized During Treatment: Gait belt;Left knee immobilizer Activity Tolerance: Patient limited by fatigue;No increased pain Patient left: in  chair;with call bell/phone within reach Nurse Communication: Mobility status PT Visit Diagnosis: Difficulty in walking, not elsewhere classified (R26.2)     Time: 1000-1030 PT Time Calculation (min) (ACUTE ONLY): 30 min  Charges:  $Gait Training: 8-22 mins $Therapeutic Activity: 8-22 mins                    G Codes:       Felecia Shelling  PTA [facility]  Acute  Rehab Pager      [phone]

## 2017-10-24 NOTE — Progress Notes (Signed)
     Subjective: 2 Days Post-Op Procedure(s) (LRB): LEFT TOTAL KNEE ARTHROPLASTY (Left)   Patient reports pain as mild, pain controlled. Patient states that she had difficulty sleeping last night, otherwise no events.  Denies any other complications. Ready to be discharged home.      Objective:   VITALS:   Vitals:   10/23/17 1933 10/24/17 0439  BP: 108/72 (!) 148/69  Pulse: 69 95  Resp: 17 14  Temp: 98 F (36.7 C) 97.6 F (36.4 C)  SpO2: 100% 99%    Dorsiflexion/Plantar flexion intact Incision: dressing C/D/I No cellulitis present Compartment soft  LABS Recent Labs    10/23/17 0501 10/24/17 0415  HGB 10.7* 10.2*  HCT 32.7* 31.8*  WBC 12.6* 12.0*  PLT 216 218    Recent Labs    10/23/17 0501  NA 137  K 4.2  BUN 9  CREATININE 0.66  GLUCOSE 120*     Assessment/Plan: 2 Days Post-Op Procedure(s) (LRB): LEFT TOTAL KNEE ARTHROPLASTY (Left) Up with therapy Discharge home  Follow up in 2 weeks at [facility]). Follow up with Dr. Thomasena Edis in 2 weeks.  Contact information:  [facility]) [address], [address] [place] [address] [phone]        Anastasio Auerbach. Nargis Abrams   PAC  10/24/2017, 8:02 AM

## 2017-10-24 NOTE — Progress Notes (Signed)
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  Crying, moaning and some anxiety about going home.    Follow Up Recommendations  Home health PT     Equipment Recommendations  3in1 (PT)    Recommendations for Other Services       Precautions / Restrictions Precautions Precautions: Fall;Knee Precaution Comments: instructed on KI use for amb  Required Braces or Orthoses: Knee Immobilizer - Left Knee Immobilizer - Left: Discontinue once straight leg raise with < 10 degree lag Restrictions Weight Bearing Restrictions: No Other Position/Activity Restrictions: WBAT    Mobility  Bed Mobility Overal bed mobility: Needs Assistance Bed Mobility: Sit to Supine     Supine to sit: Min assist Sit to supine: Max assist   General bed mobility comments: assisted back to bed required MAX assist and increased time due to pain level and emotional breakdown  Transfers Overall transfer level: Needs assistance Equipment used: Rolling walker (2 wheeled) Transfers: Sit to/from [employer] Sit to Stand: Min guard;Min assist Stand pivot transfers: Min guard;Min assist       General transfer comment: assisted on and off BSC only due to pain level and emotional breakdown  Ambulation/Gait   General Gait Details: did not amb this afternoon due to pain and emotional breakdown   A few backward steps to bed   Stairs             Wheelchair Mobility    Modified Rankin (Stroke Patients Only)       Balance                                            Cognition Arousal/Alertness: Awake/alert Behavior During Therapy: WFL for tasks assessed/performed                                    General Comments: pt emotional, crying , moaning      Exercises      General Comments        Pertinent Vitals/Pain Pain Assessment: 0-10 Pain Score: 10-Worst pain ever Pain Location: L knee Pain Descriptors / Indicators: Aching;Burning;Sore;Crying Pain Intervention(s): Monitored during session;Repositioned;Ice applied;Premedicated before session;Patient requesting pain meds-RN notified    Home Living                      Prior Function            PT Goals (current goals can now be found in the care plan section) Progress towards PT goals: Progressing toward goals    Frequency    7X/week      PT Plan Current plan remains appropriate    Co-evaluation              AM-PAC PT "6 Clicks" Daily Activity  Outcome Measure  Difficulty turning over in bed (including adjusting bedclothes, sheets and blankets)?: A Lot Difficulty moving from lying on back to sitting on the side of the bed? : A Lot Difficulty sitting down on and standing up from  a chair with arms (e.g., wheelchair, bedside commode, etc,.)?: A Lot Help needed moving to and from a bed to chair (including a wheelchair)?: A Lot Help needed walking in hospital room?: A Lot Help needed climbing 3-5 steps with a railing? : Total 6 Click Score: 11    End of Session Equipment Utilized During Treatment: Gait belt;Left knee immobilizer Activity Tolerance: Patient limited by fatigue;No increased pain Patient left: in chair;with call bell/phone within reach Nurse Communication: Mobility status PT Visit Diagnosis: Difficulty in walking, not elsewhere classified (R26.2)     Time: 1610-9604 PT Time Calculation (min) (ACUTE ONLY): 25 min  Charges:  $Gait Training: 8-22 mins $Therapeutic Activity: 8-22 mins                    G Codes:       Felecia Shelling  PTA [facility]  Acute  Rehab Pager      [phone]

## 2017-10-25 LAB — CBC
HCT: 31.6 % — ABNORMAL LOW (ref 36.0–46.0)
Hemoglobin: 10 g/dL — ABNORMAL LOW (ref 12.0–15.0)
MCH: 29.2 pg (ref 26.0–34.0)
MCHC: 31.6 g/dL (ref 30.0–36.0)
MCV: 92.4 fL (ref 78.0–100.0)
PLATELETS: 217 10*3/uL (ref 150–400)
RBC: 3.42 MIL/uL — AB (ref 3.87–5.11)
RDW: 13.7 % (ref 11.5–15.5)
WBC: 9 10*3/uL (ref 4.0–10.5)

## 2017-10-25 NOTE — Discharge Summary (Signed)
Orthopedic Discharge Summary        Physician Discharge Summary  Patient ID: Catherine Rowe MRN: [id number] DOB/AGE: [birth year] 72 y.o.  Admit date: 10/22/2017 Discharge date: 10/25/2017   Procedures:  Procedure(s) (LRB): LEFT TOTAL KNEE ARTHROPLASTY (Left)  Attending Physician:  Dr. Thomasena Edis   Admission Diagnoses:   Left knee end stage osteoarthritis  Discharge Diagnoses:  Left knee end stage osteoarthritis   Past Medical History:  Diagnosis Date  . Anemia    hx of  . Arthritis   . Complication of anesthesia    BP drops at times had to stay overnight for out patient surgery  . Headache    hx of migraine  . Heart murmur    1979   . Pneumonia    hx of  . PONV (postoperative nausea and vomiting)     PCP: Darrow Bussing, MD   Discharged Condition: fair  Hospital Course:  Patient underwent the above stated procedure on 10/22/2017. Patient tolerated the procedure well and brought to the recovery room in good condition and subsequently to the floor. Patient had an uncomplicated hospital course and was stable for discharge.   Disposition: Discharge disposition: 01-Home or Self Care      with follow up in 2 weeks   Follow-up Information    [facility].   Specialty:  Home Health Services Why:  Home Health Physical Therapy-agency will call with initial appointment Contact information: [address] [place] [address] [phone]           Discharge Instructions    Call MD / Call 911   Complete by:  As directed    If you experience chest pain or shortness of breath, CALL 911 and be transported to the hospital emergency room.  If you develope a fever above 101 F, pus (white drainage) or increased drainage or redness at the wound, or calf pain, call your surgeon's office.   Call MD / Call 911   Complete by:  As directed    If you experience chest pain or shortness of breath, CALL 911 and be transported to the hospital  emergency room.  If you develope a fever above 101 F, pus (white drainage) or increased drainage or redness at the wound, or calf pain, call your surgeon's office.   Constipation Prevention   Complete by:  As directed    Drink plenty of fluids.  Prune juice may be helpful.  You may use a stool softener, such as Colace (over the counter) 100 mg twice a day.  Use MiraLax (over the counter) for constipation as needed.   Constipation Prevention   Complete by:  As directed    Drink plenty of fluids.  Prune juice may be helpful.  You may use a stool softener, such as Colace (over the counter) 100 mg twice a day.  Use MiraLax (over the counter) for constipation as needed.   Diet - low sodium heart healthy   Complete by:  As directed    Diet - low sodium heart healthy   Complete by:  As directed    Discharge instructions   Complete by:  As directed    INSTRUCTIONS AFTER JOINT REPLACEMENT   Remove items at home which could result in a fall. This includes throw rugs or furniture in walking pathways ICE to the affected joint every three hours while awake for 30 minutes at a time, for at least the first 3-5 days, and then as needed for  pain and swelling.  Continue to use ice for pain and swelling. You may notice swelling that will progress down to the foot and ankle.  This is normal after surgery.  Elevate your leg when you are not up walking on it.   Continue to use the breathing machine you got in the hospital (incentive spirometer) which will help keep your temperature down.  It is common for your temperature to cycle up and down following surgery, especially at night when you are not up moving around and exerting yourself.  The breathing machine keeps your lungs expanded and your temperature down.   DIET:  As you were doing prior to hospitalization, we recommend a well-balanced diet.  DRESSING / WOUND CARE / SHOWERING  Keep the surgical dressing until follow up.  The dressing is water proof, so you  can shower without any extra covering.  IF THE DRESSING FALLS OFF or the wound gets wet inside, change the dressing with sterile gauze.  Please use good hand washing techniques before changing the dressing.  Do not use any lotions or creams on the incision until instructed by your surgeon.    ACTIVITY  Increase activity slowly as tolerated, but follow the weight bearing instructions below.   No driving for 6 weeks or until further direction given by your physician.  You cannot drive while taking narcotics.  No lifting or carrying greater than 10 lbs. until further directed by your surgeon. Avoid periods of inactivity such as sitting longer than an hour when not asleep. This helps prevent blood clots.  You may return to work once you are authorized by your doctor.     WEIGHT BEARING   Weight bearing as tolerated with assist device (walker, cane, etc) as directed, use it as long as suggested by your surgeon or therapist, typically at least 4-6 weeks.   EXERCISES  Results after joint replacement surgery are often greatly improved when you follow the exercise, range of motion and muscle strengthening exercises prescribed by your doctor. Safety measures are also important to protect the joint from further injury. Any time any of these exercises cause you to have increased pain or swelling, decrease what you are doing until you are comfortable again and then slowly increase them. If you have problems or questions, call your caregiver or physical therapist for advice.   Rehabilitation is important following a joint replacement. After just a few days of immobilization, the muscles of the leg can become weakened and shrink (atrophy).  These exercises are designed to build up the tone and strength of the thigh and leg muscles and to improve motion. Often times heat used for twenty to thirty minutes before working out will loosen up your tissues and help with improving the range of motion but do not use  heat for the first two weeks following surgery (sometimes heat can increase post-operative swelling).   These exercises can be done on a training (exercise) mat, on the floor, on a table or on a bed. Use whatever works the best and is most comfortable for you.    Use music or television while you are exercising so that the exercises are a pleasant break in your day. This will make your life better with the exercises acting as a break in your routine that you can look forward to.   Perform all exercises about fifteen times, three times per day or as directed.  You should exercise both the operative leg and the other leg as well.  Exercises include:   Quad Sets - Tighten up the muscle on the front of the thigh (Quad) and hold for 5-10 seconds.   Straight Leg Raises - With your knee straight (if you were given a brace, keep it on), lift the leg to 60 degrees, hold for 3 seconds, and slowly lower the leg.  Perform this exercise against resistance later as your leg gets stronger.  Leg Slides: Lying on your back, slowly slide your foot toward your buttocks, bending your knee up off the floor (only go as far as is comfortable). Then slowly slide your foot back down until your leg is flat on the floor again.  Angel Wings: Lying on your back spread your legs to the side as far apart as you can without causing discomfort.  Hamstring Strength:  Lying on your back, push your heel against the floor with your leg straight by tightening up the muscles of your buttocks.  Repeat, but this time bend your knee to a comfortable angle, and push your heel against the floor.  You may put a pillow under the heel to make it more comfortable if necessary.   A rehabilitation program following joint replacement surgery can speed recovery and prevent re-injury in the future due to weakened muscles. Contact your doctor or a physical therapist for more information on knee rehabilitation.    CONSTIPATION  Constipation is defined  medically as fewer than three stools per week and severe constipation as less than one stool per week.  Even if you have a regular bowel pattern at home, your normal regimen is likely to be disrupted due to multiple reasons following surgery.  Combination of anesthesia, postoperative narcotics, change in appetite and fluid intake all can affect your bowels.   YOU MUST use at least one of the following options; they are listed in order of increasing strength to get the job done.  They are all available over the counter, and you may need to use some, POSSIBLY even all of these options:    Drink plenty of fluids (prune juice may be helpful) and high fiber foods Colace 100 mg by mouth twice a day  Senokot for constipation as directed and as needed Dulcolax (bisacodyl), take with full glass of water  Miralax (polyethylene glycol) once or twice a day as needed.  If you have tried all these things and are unable to have a bowel movement in the first 3-4 days after surgery call either your surgeon or your primary doctor.    If you experience loose stools or diarrhea, hold the medications until you stool forms back up.  If your symptoms do not get better within 1 week or if they get worse, check with your doctor.  If you experience "the worst abdominal pain ever" or develop nausea or vomiting, please contact the office immediately for further recommendations for treatment.   ITCHING:  If you experience itching with your medications, try taking only a single pain pill, or even half a pain pill at a time.  You can also use Benadryl over the counter for itching or also to help with sleep.   TED HOSE STOCKINGS:  Use stockings on both legs until for at least 2 weeks or as directed by physician office. They may be removed at night for sleeping.  MEDICATIONS:  See your medication summary on the "After Visit Summary" that nursing will review with you.  You may have some home medications which will be placed on hold  until  you complete the course of blood thinner medication.  It is important for you to complete the blood thinner medication as prescribed.  PRECAUTIONS:  If you experience chest pain or shortness of breath - call 911 immediately for transfer to the hospital emergency department.   If you develop a fever greater that 101 F, purulent drainage from wound, increased redness or drainage from wound, foul odor from the wound/dressing, or calf pain - CONTACT YOUR SURGEON.                                                   FOLLOW-UP APPOINTMENTS:  If you do not already have a post-op appointment, please call the office for an appointment to be seen by your surgeon.  Guidelines for how soon to be seen are listed in your "After Visit Summary", but are typically between 1-4 weeks after surgery.  OTHER INSTRUCTIONS:   Knee Replacement:  Do not place pillow under knee, focus on keeping the knee straight while resting. CPM instructions: 0-90 degrees, 2 hours in the morning, 2 hours in the afternoon, and 2 hours in the evening. Place foam block, curve side up under heel at all times except when in CPM or when walking.  DO NOT modify, tear, cut, or change the foam block in any way.  MAKE SURE YOU:  Understand these instructions.  Get help right away if you are not doing well or get worse.    Thank you for letting us be a part of your medical care team.  It is a privilege we respect greatly.  We hope these instructions will help you stay on track for a fast and full recovery!   Increase activity slowly as tolerated   Complete by:  As directed    Increase activity slowly as tolerated   Complete by:  As directed       Allergies as of 10/25/2017   No Known Allergies     Medication List    TAKE these medications   acetaminophen 500 MG tablet Commonly known as:  TYLENOL Take 2 tablets (1,000 mg total) by mouth every 6 (six) hours.   aspirin EC 325 MG tablet Take 1 tablet (325 mg total) by mouth 2 (two)  times daily.   Biotin 5000 MCG Tabs Take 1 tablet by mouth daily.   CALCIUM 600 + D PO Take 2 tablets by mouth daily.   cholecalciferol 1000 units tablet Commonly known as:  VITAMIN D Take 1,000 Units by mouth daily.   GLUCOSAMINE CHOND COMPLEX/MSM PO Take 1 tablet by mouth 2 (two) times daily.   HYDROmorphone 2 MG tablet Commonly known as:  DILAUDID Take 1 tablet (2 mg total) by mouth every 4 (four) hours as needed for up to 7 days for severe pain.   methocarbamol 500 MG tablet Commonly known as:  ROBAXIN Take 1 tablet (500 mg total) by mouth every 8 (eight) hours as needed for muscle spasms.   multivitamin with minerals Tabs tablet Take 1 tablet by mouth daily.   ondansetron 4 MG tablet Commonly known as:  ZOFRAN Take 1 tablet (4 mg total) by mouth every 6 (six) hours as needed for nausea.   vitamin B-12 500 MCG tablet Commonly known as:  CYANOCOBALAMIN Take 500 mcg by mouth daily.   vitamin C 500 MG tablet Commonly known as:  ASCORBIC ACID Take 500 mg by mouth daily.            Durable Medical Equipment  (From admission, onward)        Start     Ordered   10/23/17 1150  For home use only DME 3 n 1  Once     10/23/17 1159        Signed: Thea Gist 10/25/2017, 7:15 AM  [facility] is now [employer] [address], [place], [place] [address] Phone: [phone] Facebook  Instagram  [employer]

## 2017-10-25 NOTE — Progress Notes (Signed)
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  Instructed pt to have a family member to assist to support L LE until she is able to self perform.  Assisted with amb a greater distance in hallway.  Slight emotional but more from "effort" vs pain.  Infact, her pain level is much improved.  Pt more concerned with how she will function at home.  Returned to room then performed some TKR TE's esp HS using belt.  Pt given HEP handout.  Instructed on freq as well as use of ICE.  Pt required increased time to comprehend new information.  Will see pt again this afternoon before D/C.   Follow Up Recommendations  Home health PT     Equipment Recommendations  3in1 (PT)    Recommendations for Other Services       Precautions / Restrictions Precautions Precautions: Fall;Knee Precaution Comments: did not use KI and pt did good Restrictions Weight Bearing Restrictions: No Other Position/Activity Restrictions: WBAT    Mobility  Bed Mobility Overal bed mobility: Needs Assistance Bed Mobility: Supine to Sit     Supine to sit: Min assist     General bed mobility comments: requires Min Assist to support L LE  Transfers Overall transfer level: Needs assistance Equipment used: Rolling walker (2 wheeled) Transfers: Sit to/from [employer] Sit to Stand: Supervision;Min guard Stand pivot transfers: Supervision;Min guard       General transfer comment: assist to support L LE each sit to stand and stand to sit   Ambulation/Gait Ambulation/Gait assistance: Min guard Ambulation Distance (Feet): 25 Feet Assistive device: Rolling walker (2 wheeled) Gait Pattern/deviations: Step-to pattern;Decreased step length - right;Decreased  step length - left;Shuffle;Trunk flexed Gait velocity: decreased   General Gait Details: tolerated an increased distance.     Stairs             Wheelchair Mobility    Modified Rankin (Stroke Patients Only)       Balance                                            Cognition Arousal/Alertness: Awake/alert Behavior During Therapy: WFL for tasks assessed/performed Overall Cognitive Status: Within Functional Limits for tasks assessed                                 General Comments: pt emotional but not as bad as yesterday      Exercises   Total Knee Replacement TE's 10 reps B LE ankle pumps 10 reps towel squeezes 10 reps knee presses 10 reps heel slides  10 reps SLR's 10 reps ABD Followed by ICE     General Comments        Pertinent Vitals/Pain Pain Assessment: 0-10 Pain Score: 4  Pain Location: L knee Pain Descriptors / Indicators: Constant Pain Intervention(s): Monitored during session;Repositioned    Home Living                      Prior Function  PT Goals (current goals can now be found in the care plan section) Progress towards PT goals: Progressing toward goals    Frequency    7X/week      PT Plan Current plan remains appropriate    Co-evaluation              AM-PAC PT "6 Clicks" Daily Activity  Outcome Measure  Difficulty turning over in bed (including adjusting bedclothes, sheets and blankets)?: A Lot Difficulty moving from lying on back to sitting on the side of the bed? : A Lot Difficulty sitting down on and standing up from a chair with arms (e.g., wheelchair, bedside commode, etc,.)?: A Lot Help needed moving to and from a bed to chair (including a wheelchair)?: A Lot Help needed walking in hospital room?: A Lot Help needed climbing 3-5 steps with a railing? : Total 6 Click Score: 11    End of Session Equipment Utilized During Treatment: Gait belt Activity  Tolerance: Patient tolerated treatment well Patient left: in chair;with call bell/phone within reach Nurse Communication: Mobility status PT Visit Diagnosis: Difficulty in walking, not elsewhere classified (R26.2)     Time: 1030-1110 PT Time Calculation (min) (ACUTE ONLY): 40 min  Charges:  $Gait Training: 8-22 mins $Therapeutic Exercise: 8-22 mins $Therapeutic Activity: 8-22 mins                    G Codes:       Felecia Shelling  PTA [facility]  Acute  Rehab Pager      [phone]

## 2017-10-25 NOTE — Progress Notes (Signed)
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  Pt better able to rise and lower but still requires physical assist to support L LE as pt is unable without extreme pain/weakness/anxiety.   Pt has met goals to D/C to home.  Her pain is more tolerable and no nausea.      Follow Up Recommendations  Home health PT     Equipment Recommendations  3in1 (PT)    Recommendations for Other Services       Precautions / Restrictions Precautions Precautions: Fall;Knee Precaution Comments: did not use KI and pt did good Restrictions Weight Bearing Restrictions: No Other Position/Activity Restrictions: WBAT    Mobility  Bed Mobility Overal bed mobility: Needs Assistance Bed Mobility: Supine to Sit     Supine to sit: Min assist     General bed mobility comments: Pt OOB in recliner  Transfers Overall transfer level: Needs assistance Equipment used: Rolling walker (2 wheeled) Transfers: Sit to/from [employer] Sit to Stand: Supervision;Min guard Stand pivot transfers: Supervision;Min guard       General transfer comment: assist to support L LE each sit to stand and stand to sit   Ambulation/Gait Ambulation/Gait assistance: Min guard Ambulation Distance (Feet): 32 Feet Assistive device: Rolling walker (2 wheeled) Gait Pattern/deviations: Step-to pattern;Decreased step length - right;Decreased step length - left;Shuffle;Trunk flexed Gait velocity: decreased   General Gait Details: tolerated an increased distance.     Stairs    no stairs to enter home         Wheelchair Mobility    Modified Rankin (Stroke Patients Only)       Balance                                             Cognition Arousal/Alertness: Awake/alert Behavior During Therapy: WFL for tasks assessed/performed Overall Cognitive Status: Within Functional Limits for tasks assessed                                 General Comments: pt emotional but not as bad as yesterday      Exercises      General Comments        Pertinent Vitals/Pain Pain Assessment: 0-10 Pain Score: 4  Pain Location: L knee Pain Descriptors / Indicators: Constant Pain Intervention(s): Monitored during session;Repositioned    Home Living                      Prior Function            PT Goals (current goals can now be found in the care plan section) Progress towards PT goals: Progressing toward goals    Frequency    7X/week      PT Plan Current plan remains appropriate    Co-evaluation              AM-PAC PT "6 Clicks" Daily Activity  Outcome Measure  Difficulty turning over in bed (including adjusting bedclothes, sheets and blankets)?: A Lot Difficulty moving from lying on back  to sitting on the side of the bed? : A Lot Difficulty sitting down on and standing up from a chair with arms (e.g., wheelchair, bedside commode, etc,.)?: A Lot Help needed moving to and from a bed to chair (including a wheelchair)?: A Lot Help needed walking in hospital room?: A Lot Help needed climbing 3-5 steps with a railing? : Total 6 Click Score: 11    End of Session Equipment Utilized During Treatment: Gait belt Activity Tolerance: Patient tolerated treatment well Patient left: in chair;with call bell/phone within reach Nurse Communication: Mobility status PT Visit Diagnosis: Difficulty in walking, not elsewhere classified (R26.2)     Time: 4033-5331 PT Time Calculation (min) (ACUTE ONLY): 18 min  Charges:  $Gait Training: 8-22 mins                    G Codes:       Rica Koyanagi  PTA [facility]  Acute  Rehab Pager      [phone]

## 2017-10-25 NOTE — Progress Notes (Signed)
   Subjective: 3 Days Post-Op Procedure(s) (LRB): LEFT TOTAL KNEE ARTHROPLASTY (Left)  Pt c/o mild to moderate pain at times in the left knee Ready for d/c home today Denies any new symptoms or issues Patient reports pain as mild.  Objective:   VITALS:   Vitals:   10/24/17 2137 10/25/17 0549  BP: (!) 117/55 (!) 123/57  Pulse: 64 73  Resp: 16 16  Temp: 98.3 F (36.8 C) 98.5 F (36.9 C)  SpO2: 100% 96%    Left knee incision healing well nv intact distally No rashes or edema distally Mild pain with rom  LABS Recent Labs    10/23/17 0501 10/24/17 0415 10/25/17 0424  HGB 10.7* 10.2* 10.0*  HCT 32.7* 31.8* 31.6*  WBC 12.6* 12.0* 9.0  PLT 216 218 217    Recent Labs    10/23/17 0501  NA 137  K 4.2  BUN 9  CREATININE 0.66  GLUCOSE 120*     Assessment/Plan: 3 Days Post-Op Procedure(s) (LRB): LEFT TOTAL KNEE ARTHROPLASTY (Left) D/c home today  F/u in 2 weeks in the office Pain management Pulmonary toilet  Continue PT/OT today prior to d/c    Alphonsa Overall PA-C, MPAS [facility] is now [facility] [address], [place], [place] [address] Phone: [phone] [email or web address] Facebook  [employer]

## 2017-10-25 NOTE — Plan of Care (Signed)
Patient anxious about mobility and pain.   Her plan is to go home utilizing a 72 year old grandchild for assistance during the day and no assistance at night.  Concerns about safety in this situation.

## 2017-10-26 DIAGNOSIS — Z96652 Presence of left artificial knee joint: Secondary | ICD-10-CM | POA: Diagnosis not present

## 2017-10-26 DIAGNOSIS — Z471 Aftercare following joint replacement surgery: Secondary | ICD-10-CM | POA: Diagnosis not present

## 2017-10-26 DIAGNOSIS — Z7982 Long term (current) use of aspirin: Secondary | ICD-10-CM | POA: Diagnosis not present

## 2017-10-27 DIAGNOSIS — Z96652 Presence of left artificial knee joint: Secondary | ICD-10-CM | POA: Diagnosis not present

## 2017-10-27 DIAGNOSIS — Z471 Aftercare following joint replacement surgery: Secondary | ICD-10-CM | POA: Diagnosis not present

## 2017-10-27 DIAGNOSIS — Z7982 Long term (current) use of aspirin: Secondary | ICD-10-CM | POA: Diagnosis not present

## 2017-10-29 DIAGNOSIS — Z7982 Long term (current) use of aspirin: Secondary | ICD-10-CM | POA: Diagnosis not present

## 2017-10-29 DIAGNOSIS — Z96652 Presence of left artificial knee joint: Secondary | ICD-10-CM | POA: Diagnosis not present

## 2017-10-29 DIAGNOSIS — Z471 Aftercare following joint replacement surgery: Secondary | ICD-10-CM | POA: Diagnosis not present

## 2017-11-01 DIAGNOSIS — Z471 Aftercare following joint replacement surgery: Secondary | ICD-10-CM | POA: Diagnosis not present

## 2017-11-01 DIAGNOSIS — Z7982 Long term (current) use of aspirin: Secondary | ICD-10-CM | POA: Diagnosis not present

## 2017-11-01 DIAGNOSIS — Z96652 Presence of left artificial knee joint: Secondary | ICD-10-CM | POA: Diagnosis not present

## 2017-11-03 DIAGNOSIS — Z4789 Encounter for other orthopedic aftercare: Secondary | ICD-10-CM | POA: Diagnosis not present

## 2017-11-09 ENCOUNTER — Ambulatory Visit
Admission: RE | Admit: 2017-11-09 | Discharge: 2017-11-09 | Disposition: A | Discharge status: Home / Self Care | Payer: Medicare HMO | Source: Ambulatory Visit | Attending: Family Medicine | Admitting: Family Medicine

## 2017-11-09 ENCOUNTER — Other Ambulatory Visit: Payer: Self-pay | Admitting: Family Medicine

## 2017-11-09 DIAGNOSIS — Z96652 Presence of left artificial knee joint: Secondary | ICD-10-CM | POA: Diagnosis not present

## 2017-11-09 DIAGNOSIS — R0602 Shortness of breath: Secondary | ICD-10-CM | POA: Diagnosis not present

## 2017-11-09 DIAGNOSIS — R079 Chest pain, unspecified: Secondary | ICD-10-CM | POA: Diagnosis not present

## 2017-11-09 DIAGNOSIS — M7989 Other specified soft tissue disorders: Secondary | ICD-10-CM | POA: Diagnosis not present

## 2017-11-09 MED ORDER — IOPAMIDOL (ISOVUE-370) INJECTION 76%
75.0000 mL | Freq: Once | INTRAVENOUS | Status: AC | PRN
  Administered 2017-11-09: 75 mL via INTRAVENOUS

## 2017-11-11 DIAGNOSIS — Z471 Aftercare following joint replacement surgery: Secondary | ICD-10-CM | POA: Diagnosis not present

## 2017-11-11 DIAGNOSIS — Z96652 Presence of left artificial knee joint: Secondary | ICD-10-CM | POA: Diagnosis not present

## 2017-11-11 DIAGNOSIS — Z7982 Long term (current) use of aspirin: Secondary | ICD-10-CM | POA: Diagnosis not present

## 2017-11-17 DIAGNOSIS — M25662 Stiffness of left knee, not elsewhere classified: Secondary | ICD-10-CM | POA: Diagnosis not present

## 2017-11-23 DIAGNOSIS — M25662 Stiffness of left knee, not elsewhere classified: Secondary | ICD-10-CM | POA: Diagnosis not present

## 2017-11-30 DIAGNOSIS — M25662 Stiffness of left knee, not elsewhere classified: Secondary | ICD-10-CM | POA: Diagnosis not present

## 2017-12-07 DIAGNOSIS — M25662 Stiffness of left knee, not elsewhere classified: Secondary | ICD-10-CM | POA: Diagnosis not present

## 2017-12-14 DIAGNOSIS — M25662 Stiffness of left knee, not elsewhere classified: Secondary | ICD-10-CM | POA: Diagnosis not present

## 2017-12-22 DIAGNOSIS — M25662 Stiffness of left knee, not elsewhere classified: Secondary | ICD-10-CM | POA: Diagnosis not present

## 2017-12-28 DIAGNOSIS — M25662 Stiffness of left knee, not elsewhere classified: Secondary | ICD-10-CM | POA: Diagnosis not present

## 2018-01-04 DIAGNOSIS — M25662 Stiffness of left knee, not elsewhere classified: Secondary | ICD-10-CM | POA: Diagnosis not present

## 2018-01-11 DIAGNOSIS — M25662 Stiffness of left knee, not elsewhere classified: Secondary | ICD-10-CM | POA: Diagnosis not present

## 2018-01-17 DIAGNOSIS — Z471 Aftercare following joint replacement surgery: Secondary | ICD-10-CM | POA: Diagnosis not present

## 2018-01-17 DIAGNOSIS — M25662 Stiffness of left knee, not elsewhere classified: Secondary | ICD-10-CM | POA: Diagnosis not present

## 2018-01-17 DIAGNOSIS — Z4789 Encounter for other orthopedic aftercare: Secondary | ICD-10-CM | POA: Diagnosis not present

## 2018-01-18 DIAGNOSIS — M25662 Stiffness of left knee, not elsewhere classified: Secondary | ICD-10-CM | POA: Diagnosis not present

## 2018-02-15 DIAGNOSIS — M25662 Stiffness of left knee, not elsewhere classified: Secondary | ICD-10-CM | POA: Diagnosis not present

## 2018-02-22 DIAGNOSIS — M25662 Stiffness of left knee, not elsewhere classified: Secondary | ICD-10-CM | POA: Diagnosis not present

## 2018-02-28 DIAGNOSIS — M1712 Unilateral primary osteoarthritis, left knee: Secondary | ICD-10-CM | POA: Diagnosis not present

## 2018-03-01 DIAGNOSIS — M25662 Stiffness of left knee, not elsewhere classified: Secondary | ICD-10-CM | POA: Diagnosis not present

## 2018-03-08 DIAGNOSIS — M25662 Stiffness of left knee, not elsewhere classified: Secondary | ICD-10-CM | POA: Diagnosis not present

## 2018-03-14 DIAGNOSIS — M25662 Stiffness of left knee, not elsewhere classified: Secondary | ICD-10-CM | POA: Diagnosis not present

## 2018-04-04 DIAGNOSIS — M25662 Stiffness of left knee, not elsewhere classified: Secondary | ICD-10-CM | POA: Diagnosis not present

## 2018-04-05 DIAGNOSIS — Z96652 Presence of left artificial knee joint: Secondary | ICD-10-CM | POA: Diagnosis not present

## 2018-04-05 DIAGNOSIS — Z833 Family history of diabetes mellitus: Secondary | ICD-10-CM | POA: Diagnosis not present

## 2018-04-05 DIAGNOSIS — Z809 Family history of malignant neoplasm, unspecified: Secondary | ICD-10-CM | POA: Diagnosis not present

## 2018-04-05 DIAGNOSIS — K5900 Constipation, unspecified: Secondary | ICD-10-CM | POA: Diagnosis not present

## 2018-04-05 DIAGNOSIS — Z8612 Personal history of poliomyelitis: Secondary | ICD-10-CM | POA: Diagnosis not present

## 2018-04-05 DIAGNOSIS — M1990 Unspecified osteoarthritis, unspecified site: Secondary | ICD-10-CM | POA: Diagnosis not present

## 2018-04-11 DIAGNOSIS — M25662 Stiffness of left knee, not elsewhere classified: Secondary | ICD-10-CM | POA: Diagnosis not present

## 2018-04-11 DIAGNOSIS — M25562 Pain in left knee: Secondary | ICD-10-CM | POA: Diagnosis not present

## 2018-04-11 DIAGNOSIS — Z471 Aftercare following joint replacement surgery: Secondary | ICD-10-CM | POA: Diagnosis not present

## 2018-04-11 DIAGNOSIS — Z96652 Presence of left artificial knee joint: Secondary | ICD-10-CM | POA: Diagnosis not present

## 2018-05-31 IMAGING — CR DG CHEST 2V
2 series · 2 of 2 positions shown · non-contrast
Comparison: None.

CLINICAL DATA: Preop exam for knee surgery.

EXAM:
CHEST  2 VIEW

[w chest pa]
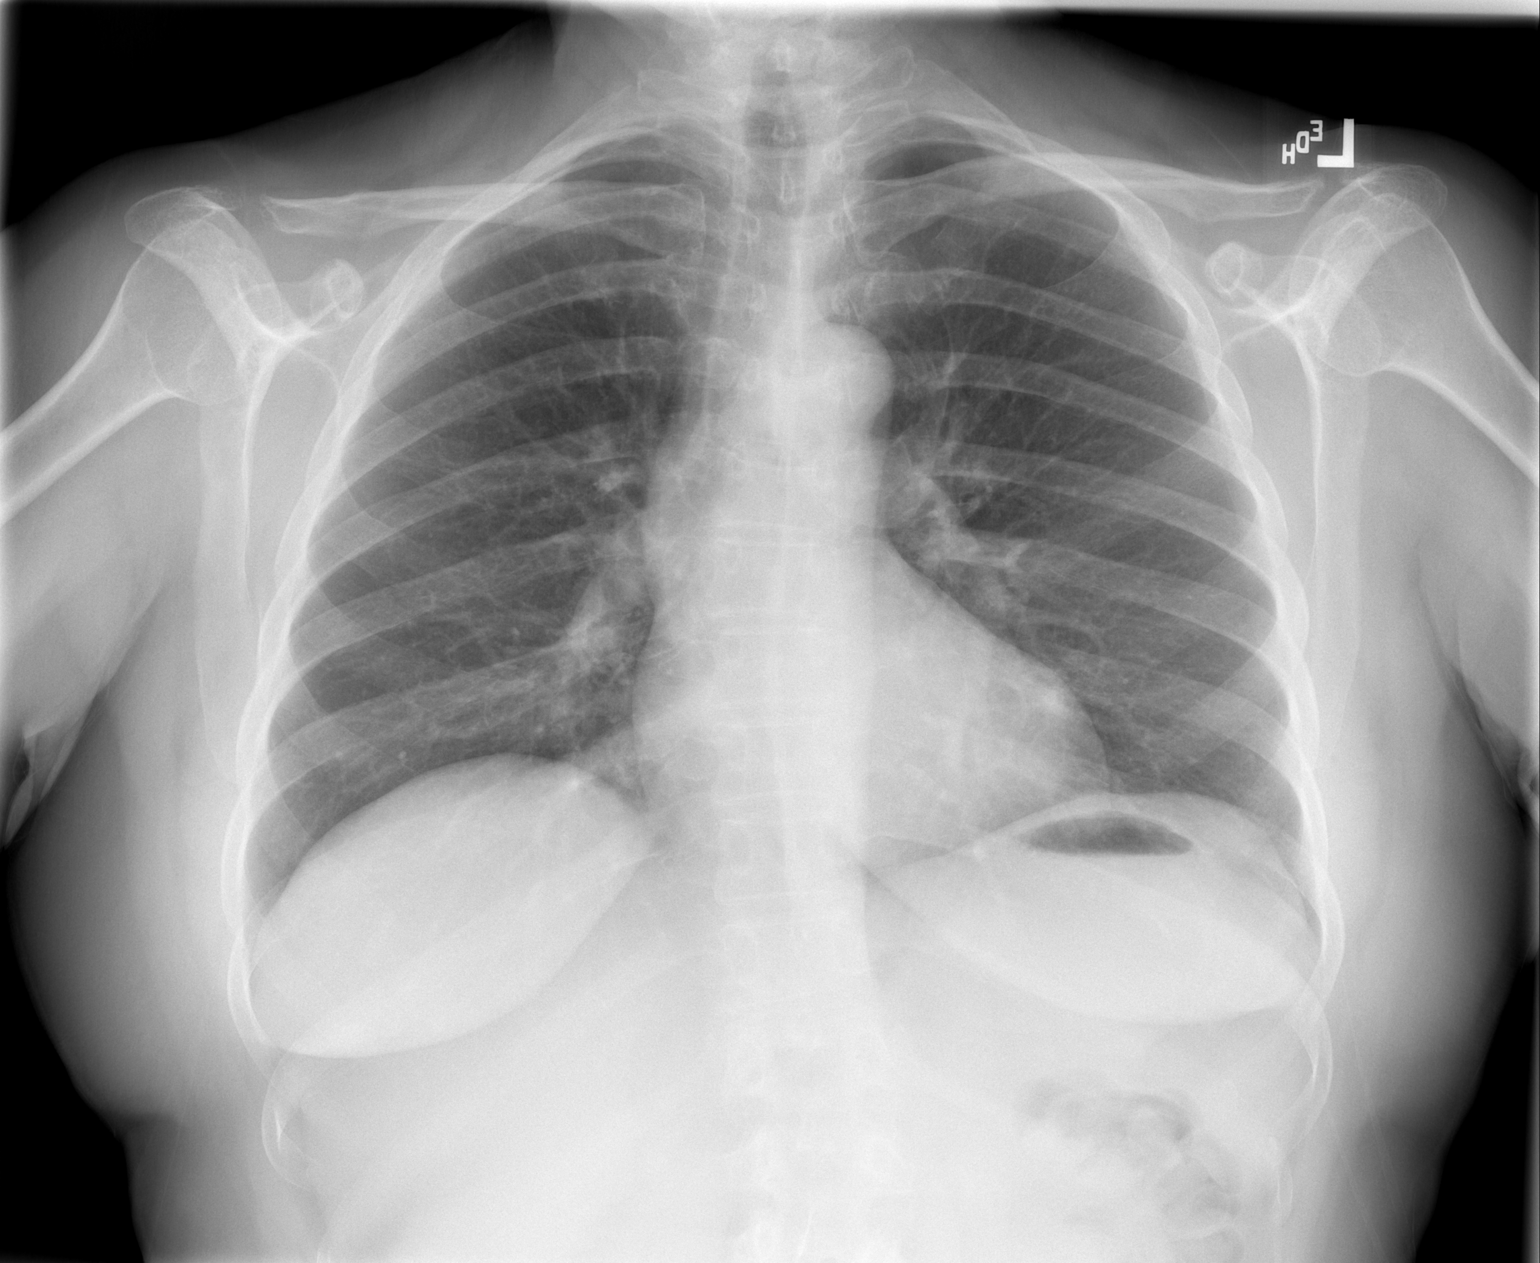

[w chest lat]
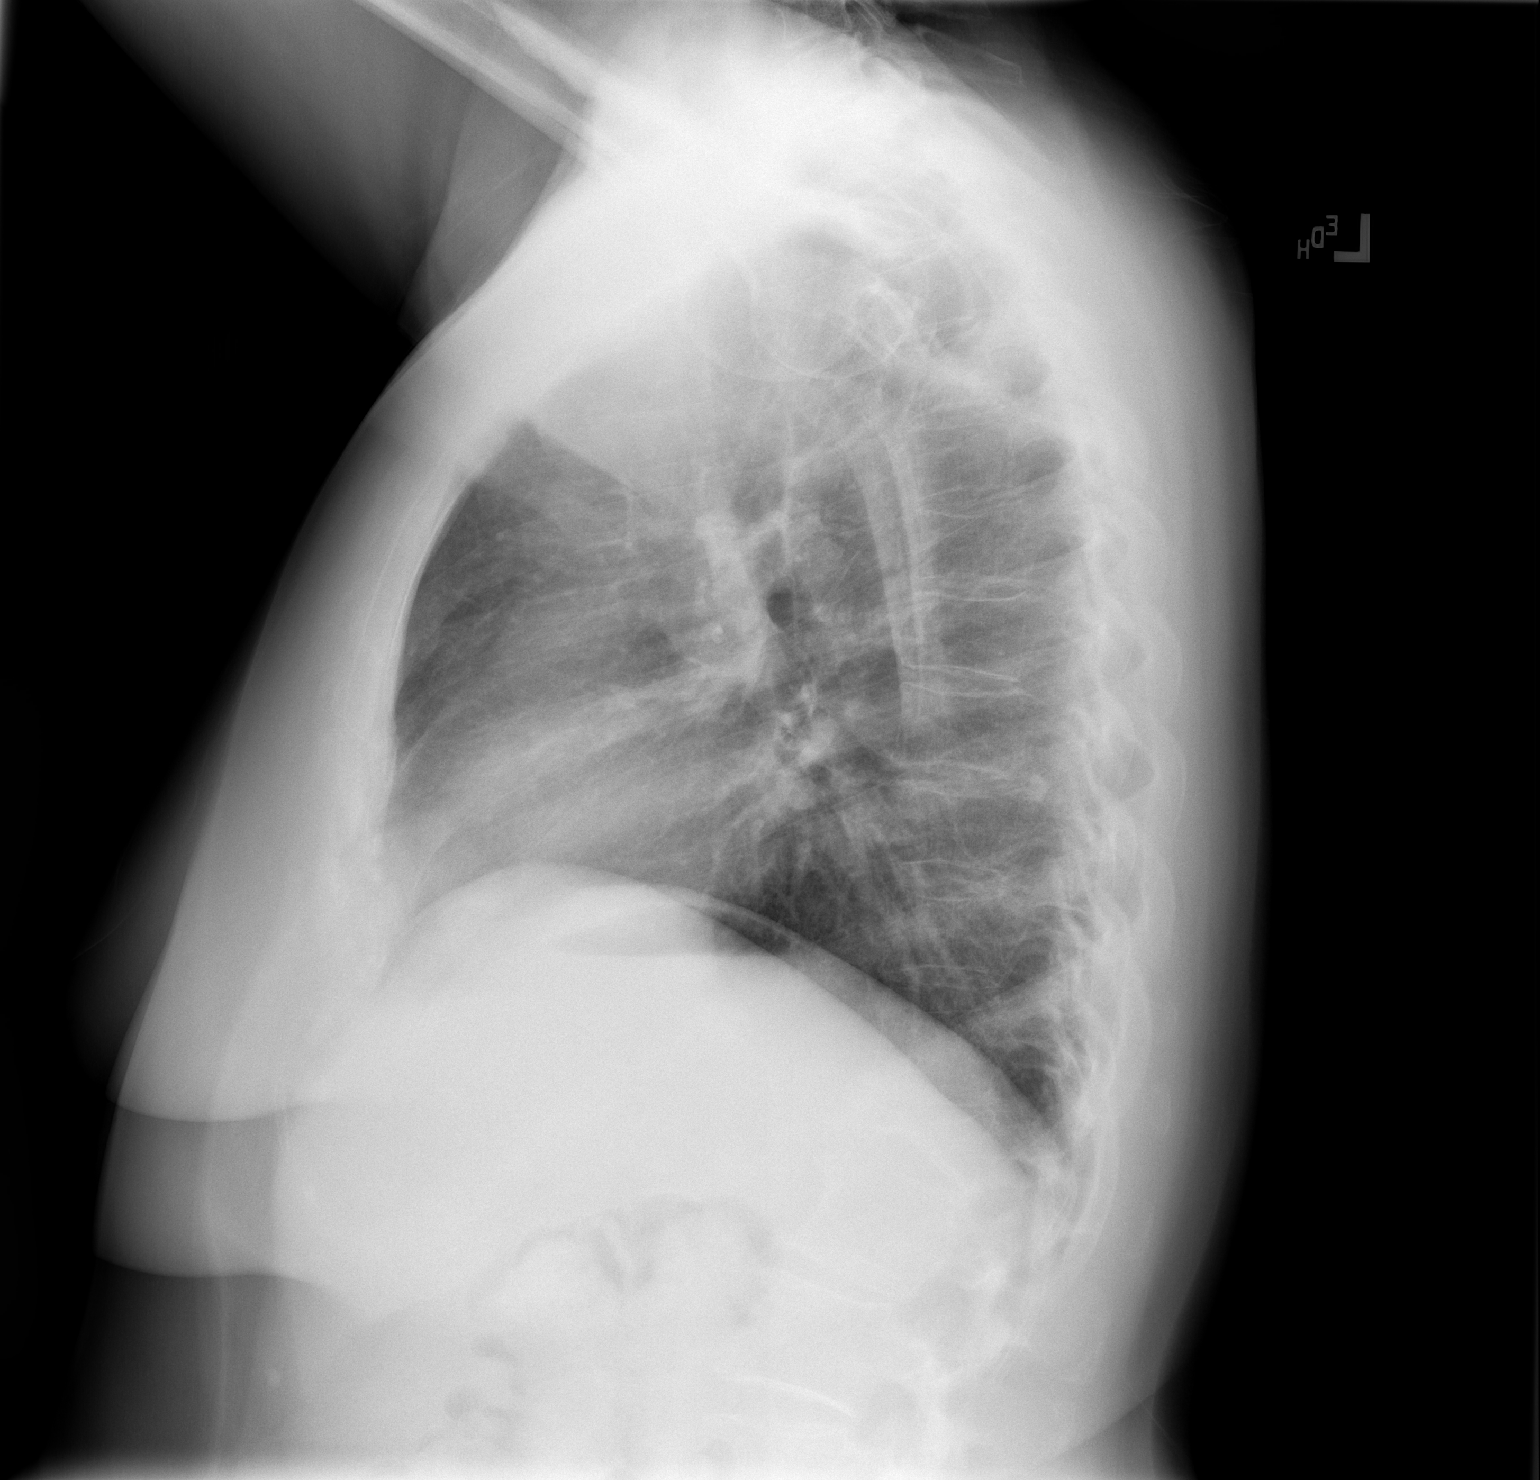

[2 of 2 positions shown; findings below may reference images not displayed]

FINDINGS: The heart size and mediastinal contours are within normal limits.
Both lungs are clear. T12 vertebral body compression deformity is
unchanged compared to previous AP CT on 11/11/2010.
IMPRESSION: No active cardiopulmonary disease.
